# Patient Record
Sex: Male | Born: 1985 | Race: White | Hispanic: No | Marital: Single | Smoking: Current every day smoker
Health system: Southern US, Community
[De-identification: ages and names within clinical notes are randomized; demographics above are authoritative.]

## PROBLEM LIST (undated history)

## (undated) DIAGNOSIS — L0291 Cutaneous abscess, unspecified: Secondary | ICD-10-CM

## (undated) HISTORY — PX: APPENDECTOMY: SHX54

---
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
## 2007-01-04 ENCOUNTER — Telehealth (INDEPENDENT_AMBULATORY_CARE_PROVIDER_SITE_OTHER): Payer: Self-pay | Admitting: *Deleted

## 2007-01-04 ENCOUNTER — Ambulatory Visit: Payer: Self-pay | Admitting: Family Medicine

## 2007-01-04 DIAGNOSIS — S2341XA Sprain of ribs, initial encounter: Secondary | ICD-10-CM

## 2007-02-06 ENCOUNTER — Encounter (INDEPENDENT_AMBULATORY_CARE_PROVIDER_SITE_OTHER): Payer: Self-pay | Admitting: *Deleted

## 2007-02-18 ENCOUNTER — Emergency Department (HOSPITAL_COMMUNITY): Admission: EM | Admit: 2007-02-18 | Discharge: 2007-02-18 | Payer: Self-pay | Admitting: Emergency Medicine

## 2007-04-30 ENCOUNTER — Emergency Department (HOSPITAL_COMMUNITY): Admission: EM | Admit: 2007-04-30 | Discharge: 2007-04-30 | Payer: Self-pay | Admitting: Emergency Medicine

## 2007-04-30 ENCOUNTER — Telehealth (INDEPENDENT_AMBULATORY_CARE_PROVIDER_SITE_OTHER): Payer: Self-pay | Admitting: *Deleted

## 2007-05-09 ENCOUNTER — Ambulatory Visit: Payer: Self-pay | Admitting: Family Medicine

## 2007-05-09 DIAGNOSIS — L0201 Cutaneous abscess of face: Secondary | ICD-10-CM

## 2007-05-09 DIAGNOSIS — L03211 Cellulitis of face: Secondary | ICD-10-CM

## 2007-05-12 ENCOUNTER — Emergency Department (HOSPITAL_COMMUNITY): Admission: EM | Admit: 2007-05-12 | Discharge: 2007-05-12 | Payer: Self-pay | Admitting: Emergency Medicine

## 2007-08-16 ENCOUNTER — Emergency Department (HOSPITAL_COMMUNITY): Admission: EM | Admit: 2007-08-16 | Discharge: 2007-08-16 | Payer: Self-pay | Admitting: Emergency Medicine

## 2007-10-22 ENCOUNTER — Encounter (INDEPENDENT_AMBULATORY_CARE_PROVIDER_SITE_OTHER): Payer: Self-pay | Admitting: *Deleted

## 2007-12-22 ENCOUNTER — Telehealth (INDEPENDENT_AMBULATORY_CARE_PROVIDER_SITE_OTHER): Payer: Self-pay | Admitting: Family Medicine

## 2008-08-02 ENCOUNTER — Emergency Department (HOSPITAL_COMMUNITY): Admission: EM | Admit: 2008-08-02 | Discharge: 2008-08-02 | Payer: Self-pay | Admitting: Emergency Medicine

## 2009-01-10 ENCOUNTER — Encounter (INDEPENDENT_AMBULATORY_CARE_PROVIDER_SITE_OTHER): Payer: Self-pay | Admitting: *Deleted

## 2009-01-10 DIAGNOSIS — F17200 Nicotine dependence, unspecified, uncomplicated: Secondary | ICD-10-CM

## 2010-08-17 NOTE — H&P (Signed)
NAMEMELODY, SAVIDGE              ACCOUNT NO.:  [id number]   MEDICAL RECORD NO.:  [id number]          PATIENT TYPE:  OBV   LOCATION:  1845                         FACILITY:  [facility]   PHYSICIAN:  Adolph Pollack, M.D.DATE OF BIRTH:  1986-02-26   DATE OF ADMISSION:  09/22/2006  DATE OF DISCHARGE:                              HISTORY & PHYSICAL   REASON FOR ADMISSION:  Appendicitis.   HISTORY OF PRESENT ILLNESS:  This 25 year old male was in his normal  state of health until about 9:30 last night and began developing some  periumbilical pain.  He just thought it was a stomach ache.  He went to  bed and awoke at 4:00 this morning with progressive pain which  eventually radiated down to his right lower quadrant.  It as associated  with some nausea, but no vomiting.  No fever, but had some chills.  Mild  dysuria.  A little bit of loose stool.  He presented to the emergency  department and was evaluated, and CT scan was consistent with acute  appendicitis.  I was asked to see him at that time.   PAST MEDICAL HISTORY:  Depression.   PREVIOUS OPERATIONS:  None.   ALLERGIES:  None.   MEDICATIONS:  Prozac - just started yesterday.   SOCIAL HISTORY:  He is single.  He does smoke cigarettes.  He is working  Forensic psychologist.   REVIEW OF SYSTEMS:  CARDIAC:  No hypertension or heart disease.  PULMONARY:  No asthma or pneumonia.  GI:  No peptic ulcer disease or  hepatitis.  GU:  No kidney stones.  ENDOCRINE:  No diabetes or  hypothyroidism.  NEUROLOGIC:  No seizures.  HEMATOLOGIC:  No bleeding  disorders or transfusions.   PHYSICAL EXAMINATION:  GENERAL:  Well-developed, well-nourished male who  did not appear to be in acute distress at this time.  VITAL SIGNS:  Temperature 97.5, blood pressure 115/56, pulse 66.  HEENT:  Extraocular movements intact, no icterus.  NECK:  Supple without obvious masses.  RESPIRATORY:  Breath sounds equal and clear.  Respirations unlabored.  CARDIOVASCULAR:   Regular rate and rhythm.  No murmur.  ABDOMEN:  Soft.  There is some right lower quadrant tenderness to both  palpation and percussion, but no palpable mass.  No CVA tenderness.  MUSCULOSKELETAL:  Good range of motion.  No edema.  NEUROLOGIC:  Alert and oriented.  Answers questions appropriately.   DATA:  Laboratory data remarkable for a UA which is negative.  White  count is 14,700, hemoglobin 14.9.  CT scan was reviewed demonstrating a  dilated appendix with some mild inflammatory changes and a small amount  of free fluid.   IMPRESSION:  Acute appendicitis, does not appear ruptured at this time.   PLAN:  IV antibiotics and laparoscopic, possible open, appendectomy.  I  discussed the procedure and the risks with him.  The risks include but  are not limited to bleeding, infection, wound healing problems,  anesthesia, accidental damage to intraabdominal organs.  He understands  this and agrees to proceed.      Adolph Pollack, M.D.  Electronically Signed     TJR/MEDQ  D:  09/22/2006  T:  09/23/2006  Job:  [id number]

## 2010-08-17 NOTE — Op Note (Signed)
NAMEAMADOU, Timothy Hess              ACCOUNT NO.:  [id number]   MEDICAL RECORD NO.:  [id number]          PATIENT TYPE:  OBV   LOCATION:  2550                         FACILITY:  [facility]   PHYSICIAN:  Cherylynn Ridges, M.D.    DATE OF BIRTH:  1985-12-28   DATE OF PROCEDURE:  09/22/2006  DATE OF DISCHARGE:                               OPERATIVE REPORT   PREOPERATIVE DIAGNOSIS:  Acute appendicitis.   POSTOPERATIVE DIAGNOSIS:  Acute appendicitis.   PROCEDURE:  Laparoscopic appendectomy.   SURGEON:  Marta Lamas. Lindie Spruce, M.D.   ANESTHESIA:  General endotracheal.   ESTIMATED BLOOD LOSS:  Less than 20 mL.   COMPLICATIONS:  None.   CONDITION:  Stable.   INDICATION FOR OPERATION:  The patient is an 25 year old with abdominal  pain localized to the right lower quadrant, who now comes in for a  laparoscopic appendectomy after CT scan demonstrates acute appendicitis.   FINDINGS:  The patient had a nonperforated, suppurative acute  appendicitis.   OPERATION:  The patient was taken to the operating room and placed on  the table in supine position.  After an adequate general anesthetic was  administered, he was prepped and draped in usual sterile manner exposing  the midline of the abdomen.   A supraumbilical curvilinear incision was made using a #11 blade, taken  down to the midline fascia.  The patient had a small fascial break at  the umbilical area and this was used in order to access the posterior  peritoneum, which was of bluntly dissected open with Kocher clamps on  the anterior fascia.  A pursestring suture of 0 Vicryl was passed around  the fascial opening into the peritoneal cavity.  The Hassan cannula was  passed into the peritoneal cavity and secured in place with a  pursestring suture.  We insufflated carbon dioxide gas up to a maximal  intra-abdominal pressure of 15 mmHg.  Once this was done a right costal  margin 5-mm cannula and a suprapubic a 12-mm cannula were passed under  direct vision into the peritoneal cavity.  The patient was placed in  Trendelenburg, the left side was tilted down, and the dissection begun.   A suppurative, acutely inflamed, large appendix was mobilized, but there  were minimal adhesions to the appendix itself, although the cecum itself  was tethered down by adhesions which were taken down.  We were able to  mobilize the base of the appendix through a window created in the  mesoappendix.  At the base we passed an Endo-GIA 3.5-mm closure staples  across the base of the appendix and cecum, stapling it off and detaching  it from the cecum.   The 2.5-mm stapler was replaced on the Endo GIA, which was passed across  the mesoappendix and fired.  This left a bloodless stump of the  mesoappendix.  We retrieved the appendix from the abdominal cavity using  an Endopouch or EndoCatch bag, which was brought out from the suprapubic  area.  Once this was done we irrigated with a small amount of saline  solution and noted that the mesoappendix area was  clean and not  bleeding.  Once we had adequate hemostasis, we aspirated all fluid and  gas from above the liver.  We removed all cannulas and closed.   The pursestring suture was used to close off the fascia at the  supraumbilical site.  Once this was done 0.25% Marcaine with epinephrine  was injected in all sites and they were closed using a running  subcuticular stitch of 5-0 Vicryl.  Sterile dressings were applied.  All  needle counts, sponge counts and instrument counts were correct.      Cherylynn Ridges, M.D.  Electronically Signed     JOW/MEDQ  D:  09/22/2006  T:  09/23/2006  Job:  [id number]

## 2010-08-17 NOTE — H&P (Signed)
NAMEHARVIR, Timothy Hess              ACCOUNT NO.:  [id number]   MEDICAL RECORD NO.:  [id number]          PATIENT TYPE:  INP   LOCATION:  5707                         FACILITY:  [facility]   PHYSICIAN:  Cherylynn Ridges, M.D.    DATE OF BIRTH:  09-Jul-1985   DATE OF ADMISSION:  09/24/2006  DATE OF DISCHARGE:                              HISTORY & PHYSICAL   CHIEF COMPLAINT:  The patient is an 25 year old status post laparoscopic  appendectomy 2 days ago who now comes in for nausea, vomiting, fever and  dehydration.   HISTORY OF PRESENT ILLNESS:  The patient underwent an uneventful  laparoscopic appendectomy by Dr. Lindie Spruce.  Surgery was uneventful in the  sense that he did not have a ruptured appendix and had minimal bleeding.  Postoperatively, he had some low grade fevers, had a little bit of  abdominal pain problem in the first 24 hours; however, this resolved by  the first hospital day.  He was able to go home on postoperative day #1,  tolerating diet well, ambulating and voiding well.   While at home he developed progressively more nausea and vomiting.  Had  a fever up to 101.3.  He was brought into the emergency room but was  found to have nausea, no vomiting, no evidence of peritonitis or sepsis.  However; because he had intractable vomiting and likely was dehydrated,  we were asked to see the patient for consideration of readmission.   PAST MEDICAL HISTORY:  Unremarkable.   PAST SURGICAL HISTORY:  Status post laparoscopic appendectomy.   For other review of systems, family history, see his H&P on admission.   PHYSICAL EXAMINATION:  VITAL SIGNS:  Currently, he is afebrile at 97.7,  pulse is 75, blood pressure 122/74, O2 sat is 97% on room air.  HEENT:  He is normocephalic and atraumatic.  His mucous membranes appear  to be a bit parched.  SKIN:  Turgor is normal.  NECK:  Supple with no palpable masses.  LUNGS: Clear.  CARDIAC:  Regular rate and rhythm.  ABDOMEN:  Soft.  He has no  peritonitis.  Good bowel sounds.  His scars  are healing well.  RECTAL:  Exam was not performed.   LABORATORY DATA:  He has a white count of 12.7000, hemoglobin of 14.7,  hematocrit of 43, platelets 212.  Electrolytes were within normal limits  with a potassium of 3.5.  Three-way abdominal films are done which  demonstrated just a pattern of ileus but not even distended small bowel.   IMPRESSION:  Postoperative nausea, vomiting and fever, likely secondary  to inflammation from his acute appendicis   PLAN:  Admit the patient for nausea and pain control.  Start him on IV  Zofran p.r.n. for nausea.  Let him take clear liquids and then hopefully  after rehydration his symptoms will resolve and he will be able to go  home.      Cherylynn Ridges, M.D.  Electronically Signed     JOW/MEDQ  D:  09/24/2006  T:  09/24/2006  Job:  [id number]

## 2010-08-20 NOTE — Discharge Summary (Signed)
Timothy Hess, Timothy Hess              ACCOUNT NO.:  [id number]   MEDICAL RECORD NO.:  [id number]          PATIENT TYPE:  OBV   LOCATION:  5703                         FACILITY:  [facility]   PHYSICIAN:  Timothy Hess, M.D.    DATE OF BIRTH:  07-15-1985   DATE OF ADMISSION:  09/22/2006  DATE OF DISCHARGE:  09/23/2006                               DISCHARGE SUMMARY   DISCHARGING PHYSICIAN:  Dr. Lindie Hess.   OPERATIVE PHYSICIAN:  Dr. Lindie Hess.   CHIEF COMPLAINT/REASON FOR ADMISSION:  Timothy Hess is an 25 year old  otherwise healthy male patient who had less than 24 hours of  periumbilical pain prior to going to bed the night before admission.  He  was awakened at four in the morning with progressive pain, now located  in the right lower quadrant.  He presented to the ER.  He was afebrile.  His abdomen was tender in the right lower quadrant.  His white count was  elevated at 14,700.  CT scan demonstrated a dilated appendix with  inflammatory changes and a small amount of free fluid consistent with  acute appendicitis.  The patient was admitted by Dr. Abbey Hess with a  diagnosis of acute appendicitis.   HOSPITAL COURSE:  The patient was started on empiric antibiotic therapy  with plans for laparoscopic, possible open appendectomy.  Later in the  day on 09/22/2006, the patient's care was picked up by Dr. Lindie Hess who  took the patient to the OR that evening a little after 6 o'clock where  the patient underwent an uncomplicated laparoscopic appendectomy.  He  was stable and sent back to the floor to recover.   On postop day #1, he was tolerating a diet, tolerating oral pain  medications and was otherwise deemed appropriate for discharge home.   DISCHARGE DIAGNOSES:  1. Acute appendicitis with associated leukocytosis/  2. Status post laparoscopic appendectomy.   DISCHARGE MEDICATIONS:  Vicodin 1-2 tablets every 4 hours as needed for  pain.   Return to work in 3 weeks, note given.   ACTIVITY:   Increase activity slowly.  May walk up steps.  May shower.  No lifting for 3 weeks.  No sexual activity until pain free.   Follow up, call Dr. Lindie Hess at [phone] to be seen in 2 weeks.      Timothy Hess, N.P.      Timothy Hess, M.D.  Electronically Signed    ALE/MEDQ  D:  10/26/2006  T:  10/27/2006  Job:  [id number]   cc:   Timothy Hess, M.D.

## 2010-08-20 NOTE — Discharge Summary (Signed)
NAMEKAESYN, JOHNSTON              ACCOUNT NO.:  [id number]   MEDICAL RECORD NO.:  [id number]          PATIENT TYPE:  INP   LOCATION:  5707                         FACILITY:  [facility]   PHYSICIAN:  Cherylynn Ridges, M.D.    DATE OF BIRTH:  [birth year]   DATE OF ADMISSION:  09/24/2006  DATE OF DISCHARGE:  09/25/2006                               DISCHARGE SUMMARY   DISCHARGE DIAGNOSIS:  Dehydration; status post a laparoscopic  appendectomy.   PROCEDURE PERFORMED:  IV hydration and pain control.   DISPOSITION:  He was discharged home in care of his family.   MEDICATIONS ON DISCHARGE:  No new ones.   FOLLOW-UP:  He was to follow up with Dr. Lindie Spruce in two weeks.   HOSPITAL COURSE:  The patient was readmitted on the 22nd after having  undergone a laparoscopic appendectomy on the 20th.  When he went home,  he had a sort of failure to thrive with inability to eat, nausea,  vomiting and dehydration.  He was readmitted for IV hydration and pain  control which improved within 24 hours and he was discharged to home.  He has followup to see Dr. Lindie Spruce in two weeks.      Cherylynn Ridges, M.D.  Electronically Signed     JOW/MEDQ  D:  11/08/2006  T:  11/09/2006  Job:  [id number]

## 2010-11-02 ENCOUNTER — Emergency Department (HOSPITAL_COMMUNITY)
Admission: EM | Admit: 2010-11-02 | Discharge: 2010-11-03 | Disposition: A | Discharge status: Home / Self Care | Payer: Medicaid Other | Attending: Emergency Medicine | Admitting: Emergency Medicine

## 2010-11-02 DIAGNOSIS — R509 Fever, unspecified: Secondary | ICD-10-CM | POA: Insufficient documentation

## 2010-11-02 DIAGNOSIS — M542 Cervicalgia: Secondary | ICD-10-CM | POA: Insufficient documentation

## 2010-11-02 DIAGNOSIS — R51 Headache: Secondary | ICD-10-CM | POA: Insufficient documentation

## 2010-11-02 DIAGNOSIS — S139XXA Sprain of joints and ligaments of unspecified parts of neck, initial encounter: Secondary | ICD-10-CM | POA: Insufficient documentation

## 2010-11-02 DIAGNOSIS — B9789 Other viral agents as the cause of diseases classified elsewhere: Secondary | ICD-10-CM | POA: Insufficient documentation

## 2010-11-02 DIAGNOSIS — X58XXXA Exposure to other specified factors, initial encounter: Secondary | ICD-10-CM | POA: Insufficient documentation

## 2010-11-03 ENCOUNTER — Emergency Department (HOSPITAL_COMMUNITY): Payer: Medicaid Other

## 2010-11-03 LAB — DIFFERENTIAL
Basophils Absolute: 0.1 10*3/uL (ref 0.0–0.1)
Lymphs Abs: 1.1 10*3/uL (ref 0.7–4.0)
Monocytes Absolute: 0.5 10*3/uL (ref 0.1–1.0)
Monocytes Relative: 8 % (ref 3–12)
Neutrophils Relative %: 71 % (ref 43–77)
WBC Morphology: INCREASED

## 2010-11-03 LAB — URINALYSIS, ROUTINE W REFLEX MICROSCOPIC
Leukocytes, UA: NEGATIVE
Protein, ur: NEGATIVE mg/dL
Urobilinogen, UA: 1 mg/dL (ref 0.0–1.0)

## 2010-11-03 LAB — POCT I-STAT, CHEM 8
Glucose, Bld: 107 mg/dL — ABNORMAL HIGH (ref 70–99)
HCT: 44 % (ref 39.0–52.0)
Hemoglobin: 15 g/dL (ref 13.0–17.0)
Potassium: 3.5 mEq/L (ref 3.5–5.1)
Sodium: 137 mEq/L (ref 135–145)

## 2010-11-03 LAB — CBC
Hemoglobin: 14.6 g/dL (ref 13.0–17.0)
MCHC: 35.9 g/dL (ref 30.0–36.0)
RDW: 13 % (ref 11.5–15.5)
WBC: 5.9 10*3/uL (ref 4.0–10.5)

## 2010-11-23 ENCOUNTER — Inpatient Hospital Stay (INDEPENDENT_AMBULATORY_CARE_PROVIDER_SITE_OTHER)
Admission: RE | Admit: 2010-11-23 | Discharge: 2010-11-23 | Disposition: A | Discharge status: Home / Self Care | Payer: Medicaid Other | Source: Ambulatory Visit | Attending: Emergency Medicine | Admitting: Emergency Medicine

## 2010-11-23 DIAGNOSIS — L0211 Cutaneous abscess of neck: Secondary | ICD-10-CM

## 2010-11-23 LAB — GLUCOSE, CAPILLARY: Glucose-Capillary: 111 mg/dL — ABNORMAL HIGH (ref 70–99)

## 2011-01-19 LAB — URINE MICROSCOPIC-ADD ON

## 2011-01-19 LAB — I-STAT 8, (EC8 V) (CONVERTED LAB)
Acid-Base Excess: 2
BUN: 9
Bicarbonate: 24.1 — ABNORMAL HIGH
Chloride: 105
Glucose, Bld: 132 — ABNORMAL HIGH
HCT: 45
Hemoglobin: 15.3
Operator id: 270111
Potassium: 3.3 — ABNORMAL LOW
Sodium: 136
TCO2: 25
pCO2, Ven: 31.2 — ABNORMAL LOW
pH, Ven: 7.495 — ABNORMAL HIGH

## 2011-01-19 LAB — COMPREHENSIVE METABOLIC PANEL
ALT: 15
AST: 18
Calcium: 9.2
GFR calc Af Amer: >60
Glucose, Bld: 104 — ABNORMAL HIGH
Sodium: 136
Total Protein: 6.6

## 2011-01-19 LAB — CBC
HCT: 43.1
Hemoglobin: 14.7
MCHC: 33.6
MCHC: 34.1
MCV: 87.1
Platelets: 212
RBC: 4.95
RDW: 12.8
RDW: 13
WBC: 12.7 — ABNORMAL HIGH

## 2011-01-19 LAB — DIFFERENTIAL
Basophils Absolute: 0
Basophils Relative: 0
Eosinophils Absolute: 0
Eosinophils Absolute: 0.1
Eosinophils Relative: 0
Lymphocytes Relative: 4 — ABNORMAL LOW
Lymphs Abs: 0.6 — ABNORMAL LOW
Lymphs Abs: 1.8
Monocytes Absolute: 1.2
Monocytes Relative: 8
Monocytes Relative: 9
Neutro Abs: 10.9 — ABNORMAL HIGH
Neutrophils Relative %: 79 — ABNORMAL HIGH
Neutrophils Relative %: 86 — ABNORMAL HIGH

## 2011-01-19 LAB — URINALYSIS, ROUTINE W REFLEX MICROSCOPIC
Bilirubin Urine: NEGATIVE
Glucose, UA: NEGATIVE
Hgb urine dipstick: NEGATIVE
Ketones, ur: 40 — AB
Ketones, ur: NEGATIVE
Nitrite: NEGATIVE
Nitrite: NEGATIVE
Protein, ur: 30 — AB
Protein, ur: NEGATIVE
Specific Gravity, Urine: 1.046 — ABNORMAL HIGH
Urobilinogen, UA: 0.2
Urobilinogen, UA: 2 — ABNORMAL HIGH
pH: 7

## 2011-01-19 LAB — POCT I-STAT CREATININE
Creatinine, Ser: 1
Operator id: 270111

## 2011-05-21 ENCOUNTER — Emergency Department (HOSPITAL_COMMUNITY)
Admission: EM | Admit: 2011-05-21 | Discharge: 2011-05-21 | Disposition: A | Discharge status: Home / Self Care | Payer: Medicaid Other | Source: Home / Self Care | Attending: Emergency Medicine | Admitting: Emergency Medicine

## 2011-05-21 ENCOUNTER — Encounter (HOSPITAL_COMMUNITY): Payer: Self-pay | Admitting: *Deleted

## 2011-05-21 DIAGNOSIS — L0390 Cellulitis, unspecified: Secondary | ICD-10-CM

## 2011-05-21 DIAGNOSIS — L0291 Cutaneous abscess, unspecified: Secondary | ICD-10-CM

## 2011-05-21 DIAGNOSIS — L03221 Cellulitis of neck: Secondary | ICD-10-CM

## 2011-05-21 HISTORY — DX: Cutaneous abscess, unspecified: L02.91

## 2011-05-21 MED ORDER — SULFAMETHOXAZOLE-TRIMETHOPRIM 800-160 MG PO TABS: 1.0000 | ORAL_TABLET | Freq: Two times a day (BID) | ORAL | Status: AC

## 2011-05-21 MED ORDER — MUPIROCIN 2 % EX OINT: TOPICAL_OINTMENT | Freq: Two times a day (BID) | CUTANEOUS | Status: AC

## 2011-05-21 MED ORDER — TRAMADOL HCL 50 MG PO TABS: 100.0000 mg | ORAL_TABLET | Freq: Three times a day (TID) | ORAL | Status: AC | PRN

## 2011-05-21 MED ORDER — RIFAMPIN 300 MG PO CAPS: 300.0000 mg | ORAL_CAPSULE | Freq: Two times a day (BID) | ORAL | Status: AC

## 2011-05-21 NOTE — Discharge Instructions (Signed)
Abscess An abscess (boil or furuncle) is an infected area that contains a collection of pus.  SYMPTOMS Signs and symptoms of an abscess include pain, tenderness, redness, or hardness. You may feel a moveable soft area under your skin. An abscess can occur anywhere in the body.  TREATMENT  A surgical cut (incision) may be made over your abscess to drain the pus. Gauze may be packed into the space or a drain may be looped through the abscess cavity (pocket). This provides a drain that will allow the cavity to heal from the inside outwards. The abscess may be painful for a few days, but should feel much better if it was drained.  Your abscess, if seen early, may not have localized and may not have been drained. If not, another appointment may be required if it does not get better on its own or with medications. HOME CARE INSTRUCTIONS   Only take over-the-counter or prescription medicines for pain, discomfort, or fever as directed by your caregiver.   Take your antibiotics as directed if they were prescribed. Finish them even if you start to feel better.   Keep the skin and clothes clean around your abscess.   If the abscess was drained, you will need to use gauze dressing to collect any draining pus. Dressings will typically need to be changed 3 or more times a day.   The infection may spread by skin contact with others. Avoid skin contact as much as possible.   Practice good hygiene. This includes regular hand washing, cover any draining skin lesions, and do not share personal care items.   If you participate in sports, do not share athletic equipment, towels, whirlpools, or personal care items. Shower after every practice or tournament.   If a draining area cannot be adequately covered:   Do not participate in sports.   Children should not participate in day care until the wound has healed or drainage stops.   If your caregiver has given you a follow-up appointment, it is very important  to keep that appointment. Not keeping the appointment could result in a much worse infection, chronic or permanent injury, pain, and disability. If there is any problem keeping the appointment, you must call back to this facility for assistance.  SEEK MEDICAL CARE IF:   You develop increased pain, swelling, redness, drainage, or bleeding in the wound site.   You develop signs of generalized infection including muscle aches, chills, fever, or a general ill feeling.   You have an oral temperature above 102 F (38.9 C).  MAKE SURE YOU:   Understand these instructions.   Will watch your condition.   Will get help right away if you are not doing well or get worse.  Document Released: 12/29/2004 Document Revised: 12/01/2010 Document Reviewed: 10/23/2007 [facility] Patient Information 2012 [place], [place].  You have had an abscess drained.  An abscess is a collection of pus caused by infection with skin bacteria such as Streptococcus or Staphylococcus.  Since this is and infection, you may be contagious.  For the first 2 days, leave the dressing in place and keep it clean and dry. This means you should not get it wet.  You will have to take a sponge bath rather than a shower.  If the abscess was packed, we may instruct you to come back in 2 to 3 days to have the packing removed.  If the abscess was not packed, you may remove the dressing yourself in 2 days and take  care of the wound yourself.  After the packing is out, change the dressing at least once a day.  You may bathe or shower once the packing has been removed.  Assemble all the dressing material before you change the dressing, wear gloves, dispose of the soiled dressing material well and wash your hands before and after changing the dressing.  Wash the area well with soap and water, taking care to remove all the dried blood and drainage.  Apply a thin layer of antibiotic ointment (Bacitracin or Polysporin) around the abscess cavity, then apply  a gauze dressing.  You may want to use a non-adherent dressing like Telfa.  Fasten this in place well with tape.  Continue to change the dressing until there is no further drainage.  Finish up the entire prescription of any antibiotics that you have been given.  Take infectious precautions since the bacteria that cause these abscesses may be contagious.  Wash hands frequently or use hand sanitizer, especially after touching the abscess area or changing dressings.  Do not allow anyone else to use your towel or washcloth and wash these items after each use until the abscess has healed.  You may want to use an antibacterial soap such as Dial or Safeguard or a prescription body wash like Hibiclens.  You also may want to consider spraying the tub or shower with a disinfectant such a Lysol until the abscess has healed.  Things that should prompt you to return to the office for a recheck include:  Fever over 100 degrees, increasing pain or drainage, failure of the abscess to heal after 10 days, or other skin lesions elsewhere.   

## 2011-05-21 NOTE — ED Notes (Signed)
Pt with abscess posterior neck and left side of face -  Back of neck abscess drained few months ago per pt - painful last few days

## 2011-05-21 NOTE — ED Provider Notes (Signed)
Chief Complaint  Patient presents with  . Abscess  . Recurrent Skin Infections    History of Present Illness:  Rakin is a 26 year old male who has had recurring abscesses. The first of these was last summer. He had abscess in his posterior neck this was I&D and showed MRSA. He was treated with doxycycline and got better and over the past month he's had recurrence of the abscesses both on the posterior neck and along the left jaw line. He denies any fever, chills, or drainage from any of these lesions. Both his wife and his son had MRSA as well.  Review of Systems:  Other than noted above, the patient denies any of the following symptoms: Systemic:  No fever, chills or sweats. Skin:  No rash or itching.  PMFSH:  Past medical history, family history, social history, meds, and allergies were reviewed.  Physical Exam:   Vital signs:  BP 137/67  Pulse 58  Temp(Src) 98.3 F (36.8 C) (Oral)  Resp 18  SpO2 100% Skin:  There is a 1.5 cm raised, red, tender nodule in the posterior neck just at the hairline. This was somewhat fluctuant. He also has a tiny erythematous bump along the left anterior neck and a large lymph node at the jugulodigastric area,  Otherwise normal.  No rash.  Procedure:  Verbal informed consent was obtained.  The patient was informed of the risks and benefits of the procedure and understands and accepts.  Identity of the patient was verified verbally and by wristband.   The abscess area described above was prepped with Betadine and alcohol and anesthetized with 3 mL of 2% Xylocaine with epinephrine.  Using a #11 scalpel blade, a singe straight incision was made into the area of fluctulence, yielding a small amount of prurulent drainage.  Routine cultures were obtained.  Blunt dissection was used to break up loculations and the resulting wound cavity was packed with 1/4 inch Iodoform gauze.  A sterile pressure dressing was applied.  Assessment:   Diagnoses that have been ruled  out:  None  Diagnoses that are still under consideration:  None  Final diagnoses:  Abscess    Plan:   1.  The following meds were prescribed:   New Prescriptions   MUPIROCIN OINTMENT (BACTROBAN) 2 %    Apply topically 2 (two) times daily.   RIFAMPIN (RIFADIN) 300 MG CAPSULE    Take 1 capsule (300 mg total) by mouth 2 (two) times daily.   SULFAMETHOXAZOLE-TRIMETHOPRIM (BACTRIM DS,SEPTRA DS) 800-160 MG PER TABLET    Take 1 tablet by mouth 2 (two) times daily.   TRAMADOL (ULTRAM) 50 MG TABLET    Take 2 tablets (100 mg total) by mouth every 8 (eight) hours as needed for pain.   2.  The patient was instructed in symptomatic care and handouts were given. 3.  The patient was instructed to leave the dressing in place and return again in 48 hours for packing removal.  Roque Lias, MD 05/21/11 2049

## 2011-05-24 LAB — CULTURE, ROUTINE-ABSCESS: Special Requests: NORMAL

## 2011-05-25 NOTE — ED Notes (Signed)
Abscess culture neck: Few Staph. Aureus. Pt. adequately treated with Bactrim DS. Vassie Moselle 05/25/2011

## 2012-10-04 ENCOUNTER — Encounter (HOSPITAL_COMMUNITY): Payer: Self-pay | Admitting: Emergency Medicine

## 2012-10-04 ENCOUNTER — Emergency Department (HOSPITAL_COMMUNITY)
Admission: EM | Admit: 2012-10-04 | Discharge: 2012-10-04 | Disposition: A | Discharge status: Home / Self Care | Payer: Self-pay | Attending: Emergency Medicine | Admitting: Emergency Medicine

## 2012-10-04 DIAGNOSIS — M545 Low back pain, unspecified: Secondary | ICD-10-CM | POA: Insufficient documentation

## 2012-10-04 DIAGNOSIS — R269 Unspecified abnormalities of gait and mobility: Secondary | ICD-10-CM | POA: Insufficient documentation

## 2012-10-04 DIAGNOSIS — Z79899 Other long term (current) drug therapy: Secondary | ICD-10-CM | POA: Insufficient documentation

## 2012-10-04 DIAGNOSIS — Z87828 Personal history of other (healed) physical injury and trauma: Secondary | ICD-10-CM | POA: Insufficient documentation

## 2012-10-04 DIAGNOSIS — IMO0001 Reserved for inherently not codable concepts without codable children: Secondary | ICD-10-CM | POA: Insufficient documentation

## 2012-10-04 DIAGNOSIS — F17200 Nicotine dependence, unspecified, uncomplicated: Secondary | ICD-10-CM | POA: Insufficient documentation

## 2012-10-04 DIAGNOSIS — Z872 Personal history of diseases of the skin and subcutaneous tissue: Secondary | ICD-10-CM | POA: Insufficient documentation

## 2012-10-04 MED ORDER — DIAZEPAM 5 MG PO TABS
10.0000 mg | ORAL_TABLET | Freq: Once | ORAL | Status: AC
  Administered 2012-10-04: 10 mg via ORAL
  Filled 2012-10-04: qty 2

## 2012-10-04 MED ORDER — IBUPROFEN 800 MG PO TABS: 800.0000 mg | ORAL_TABLET | Freq: Three times a day (TID) | ORAL | Status: DC

## 2012-10-04 MED ORDER — DIAZEPAM 5 MG PO TABS: 5.0000 mg | ORAL_TABLET | Freq: Two times a day (BID) | ORAL | Status: DC | PRN

## 2012-10-04 MED ORDER — IBUPROFEN 800 MG PO TABS
800.0000 mg | ORAL_TABLET | Freq: Once | ORAL | Status: AC
  Administered 2012-10-04: 800 mg via ORAL
  Filled 2012-10-04: qty 1

## 2012-10-04 NOTE — ED Provider Notes (Signed)
Medical screening examination/treatment/procedure(s) were performed by non-physician practitioner and as supervising physician I was immediately available for consultation/collaboration.   Lyanne Co, MD 10/04/12 [phone]

## 2012-10-04 NOTE — ED Provider Notes (Signed)
History    CSN: [id number] Arrival date & time 10/04/12  1223  First MD Initiated Contact with Patient 10/04/12 1232     Chief Complaint  Patient presents with  . Back Pain   (Consider location/radiation/quality/duration/timing/severity/associated sxs/prior Treatment) HPI Pt is a 27yo male c/o LBP, states he had a back injury when he was younger.  This morning when getting out of bed, back pain flared up.  Difficult to get out of bed. Pain is constant, sharp and stabbing in lower back, worse with movement, 8/10. Has not tried any pain meds. Pt is walking on his feet all day at work yesterday. Denies any heavy lifting.  Denies numbness or tingling in groin or legs. Denies loss of bowel or bladder. Denies inability to void. Denies dysuria or hematuria.  No fever, n/v/d. No hx of CA or IVDU.   Past Medical History  Diagnosis Date  . Abscess    Past Surgical History  Procedure Laterality Date  . Appendectomy     No family history on file. History  Substance Use Topics  . Smoking status: Current Every Day Smoker -- 1.00 packs/day for 5 years    Types: Cigarettes  . Smokeless tobacco: Never Used  . Alcohol Use: Yes    Review of Systems  Constitutional: Negative for fever and chills.  Genitourinary: Negative for dysuria, hematuria and flank pain.  Musculoskeletal: Positive for myalgias, back pain and gait problem ( pain with walking). Negative for arthralgias.  Skin: Negative for rash.  All other systems reviewed and are negative.    Allergies  Vicodin  Home Medications   Current Outpatient Rx  Name  Route  Sig  Dispense  Refill  . diazepam (VALIUM) 5 MG tablet   Oral   Take 1 tablet (5 mg total) by mouth every 12 (twelve) hours as needed (Take 1-2 pills every 8-12 hours as needed for back pain).   10 tablet   0   . doxycycline (ADOXA) 100 MG tablet   Oral   Take 100 mg by mouth 2 (two) times daily. X 10 days          . FLUoxetine (PROZAC) 20 MG capsule   Oral  Take 20 mg by mouth daily.           Marland Kitchen ibuprofen (ADVIL,MOTRIN) 800 MG tablet   Oral   Take 1 tablet (800 mg total) by mouth 3 (three) times daily.   21 tablet   0   . propoxyphene-acetaminophen (DARVOCET-N 100) 100-650 MG per tablet   Oral   Take 1 tablet by mouth every 4 (four) hours as needed.            BP 138/76  Pulse 62  Temp(Src) 97.8 F (36.6 C) (Oral)  Resp 18  SpO2 100% Physical Exam  Nursing note and vitals reviewed. Constitutional: He appears well-developed and well-nourished. No distress.  Pt sitting in exam chair, appears uncomfortable, shifting wearing in chair  HENT:  Head: Normocephalic and atraumatic.  Eyes: Conjunctivae are normal. No scleral icterus.  Neck: Normal range of motion. Neck supple.  No midline bone tenderness, no crepitus or step-offs.   No nuchal rigidity or meningeal signs.    Cardiovascular: Normal rate, regular rhythm and normal heart sounds.   Pulmonary/Chest: Effort normal and breath sounds normal. No respiratory distress. He has no wheezes. He has no rales. He exhibits no tenderness.  Musculoskeletal: Normal range of motion. He exhibits tenderness ( paraspinal muscles of lumbar spine). He  exhibits no edema.  Muscular tenderness, no bony spinal tenderness, stepoffs or crepitus  Neurological: He is alert.  Antalgic gait   Skin: Skin is warm and dry. He is not diaphoretic.  Psychiatric: He has a normal mood and affect. His behavior is normal.    ED Course  Procedures (including critical care time) Labs Reviewed - No data to display No results found. 1. LBP (low back pain)     MDM  No red flags. No recent trauma.  Flare of chronic LBP.  Tx with valium and Ibuprofen in ED. Rx: valium and ibuprofen.  Will discharge pt home and have him f/u with his PCP at [facility] as needed for LBP and ongoing medical needs. Return precautions given. Pt verbalized understanding and agreement with tx plan. Vitals: unremarkable.  Discharged in stable condition.      Junius Finner, PA-C 10/04/12 1334

## 2012-10-04 NOTE — ED Notes (Signed)
Pt reports 8/10 lower back pain. Pt reports having chronic back pain from an fall accident when he was a teenager. Pt reports he had slight pain yesterday, however the pain was unbearable when he awoke this morning. Pt reports not being able to cough, bending or stretching because it increases the pain.

## 2013-03-15 IMAGING — CR DG CERVICAL SPINE COMPLETE 4+V
5 series · 5 of 5 positions shown · non-contrast
Comparison: None.

CLINICAL DATA: Neck injury.  Neck pain.

CERVICAL SPINE - COMPLETE 4+ VIEW

[w c-spine lat]
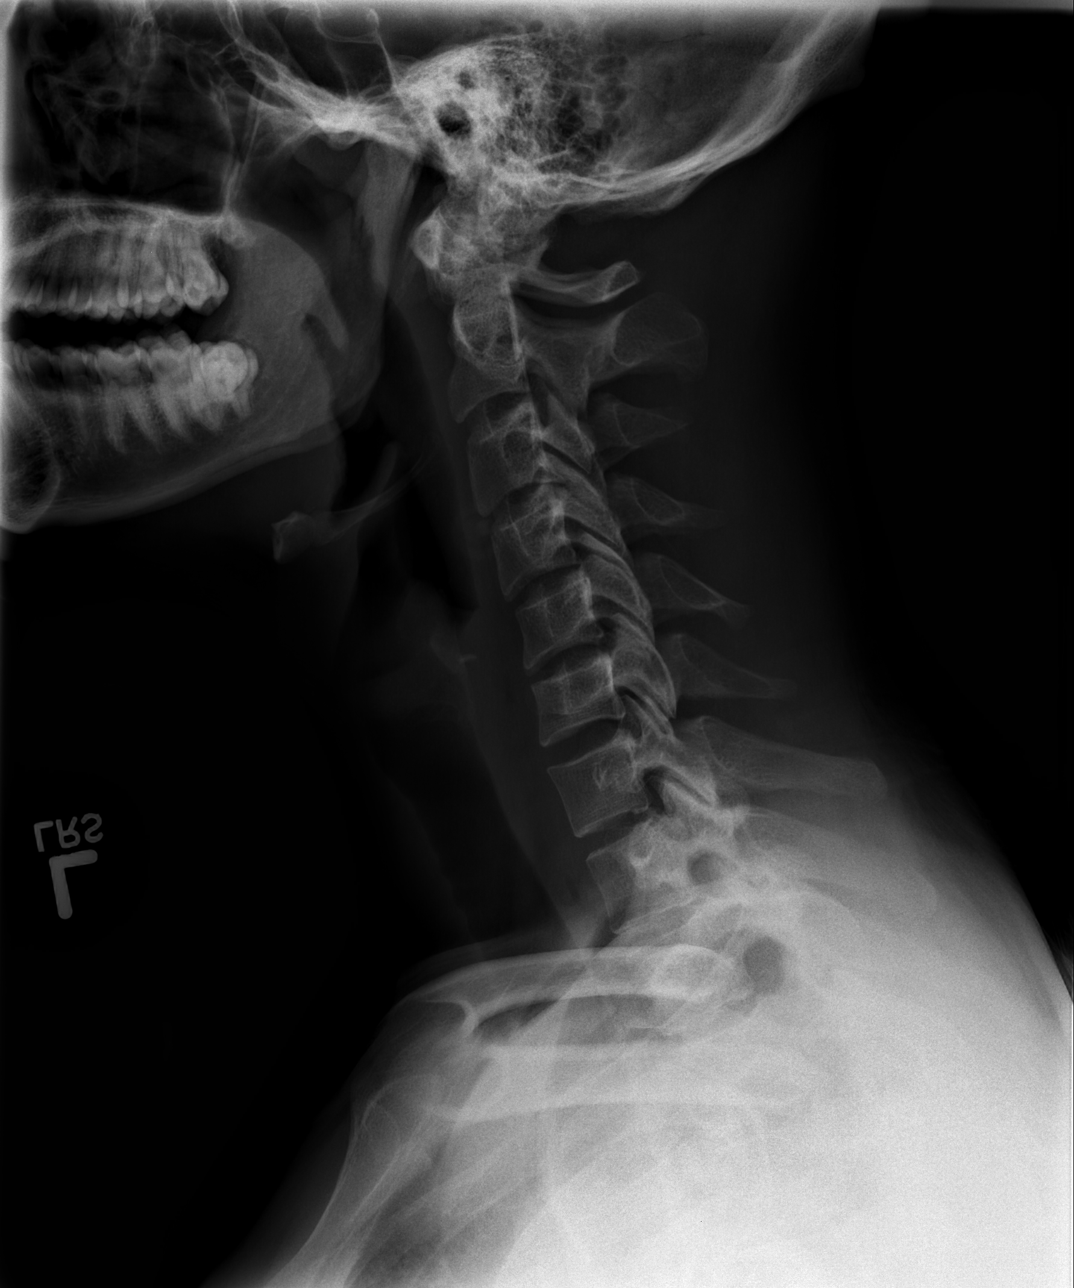

[w c-spine oblique (1 of 2)]
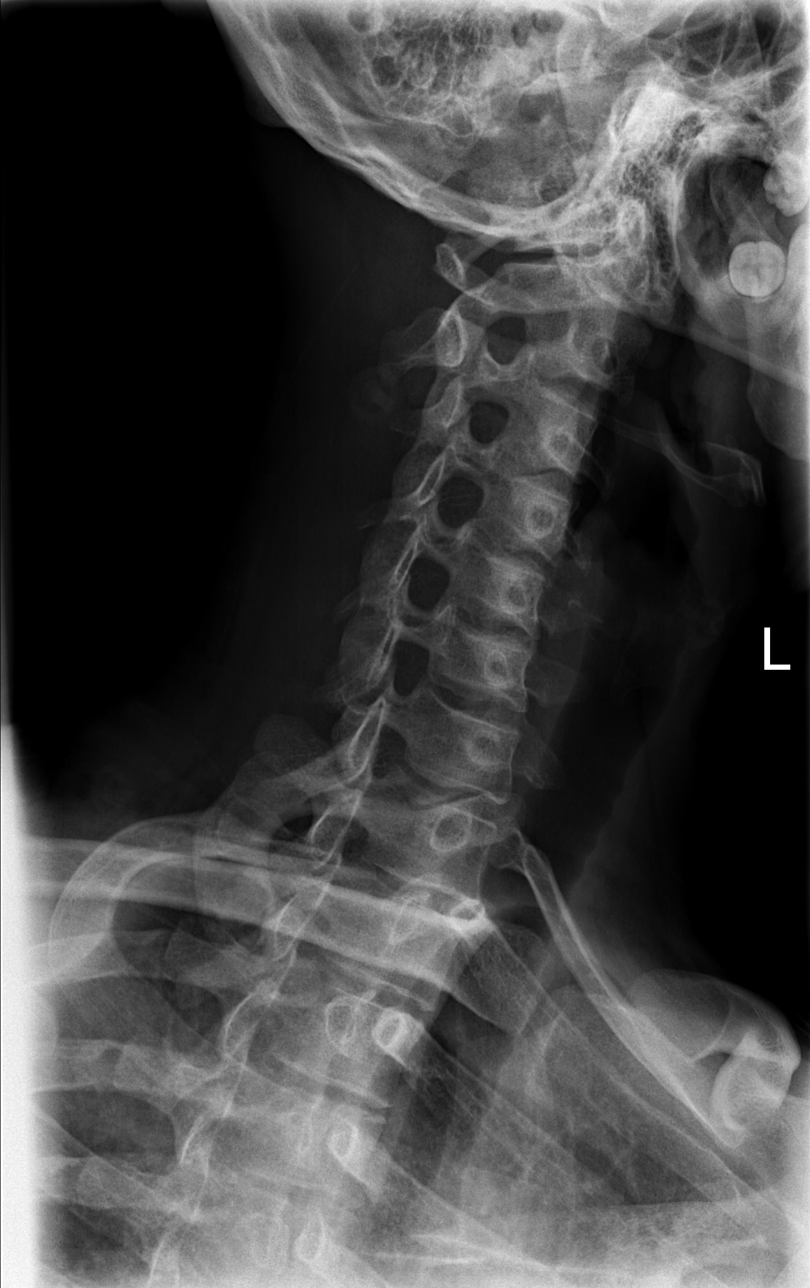

[w c-spine oblique (2 of 2)]
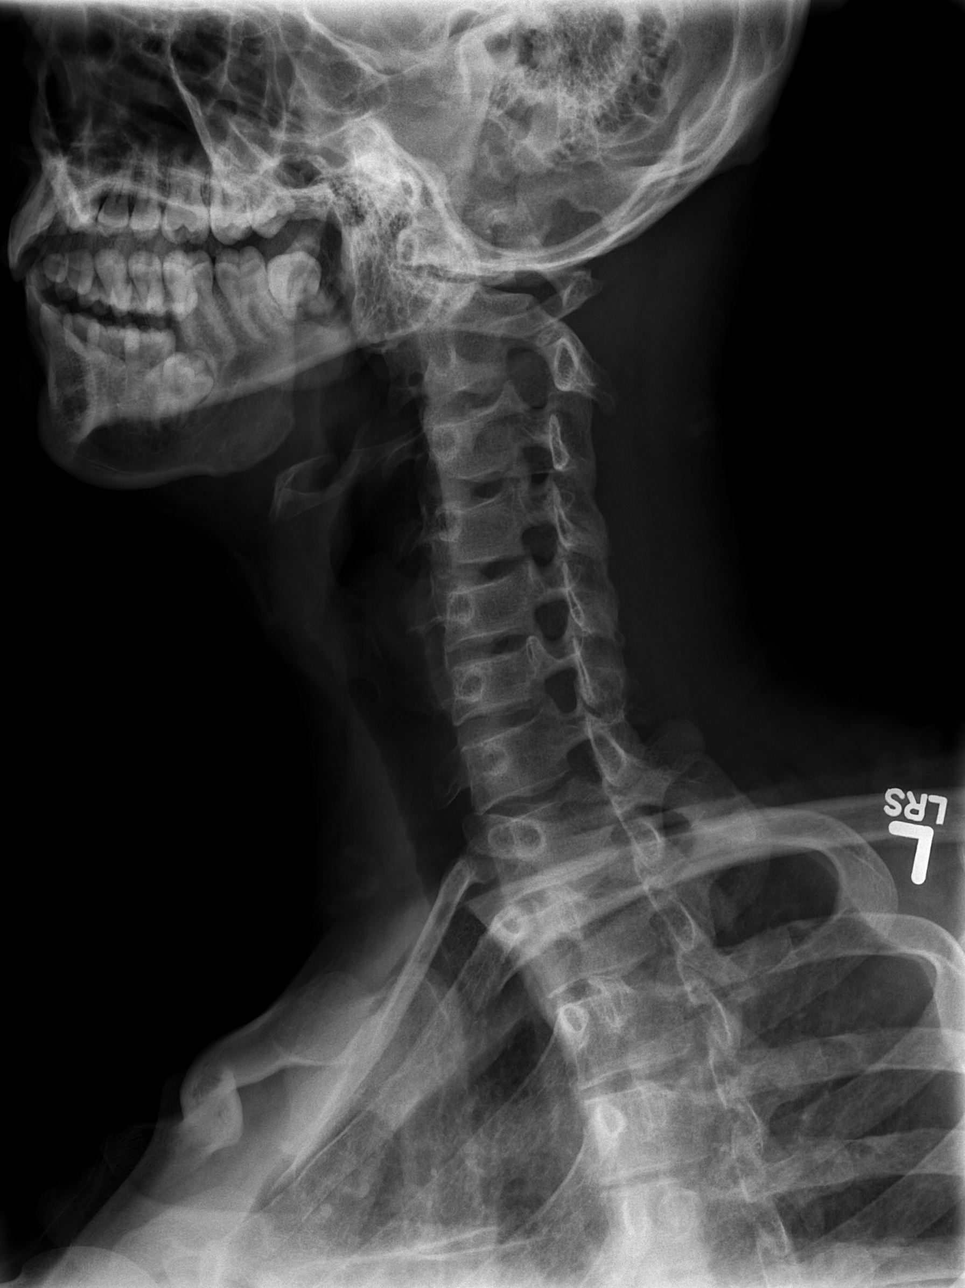

[w c-spine a.p.]
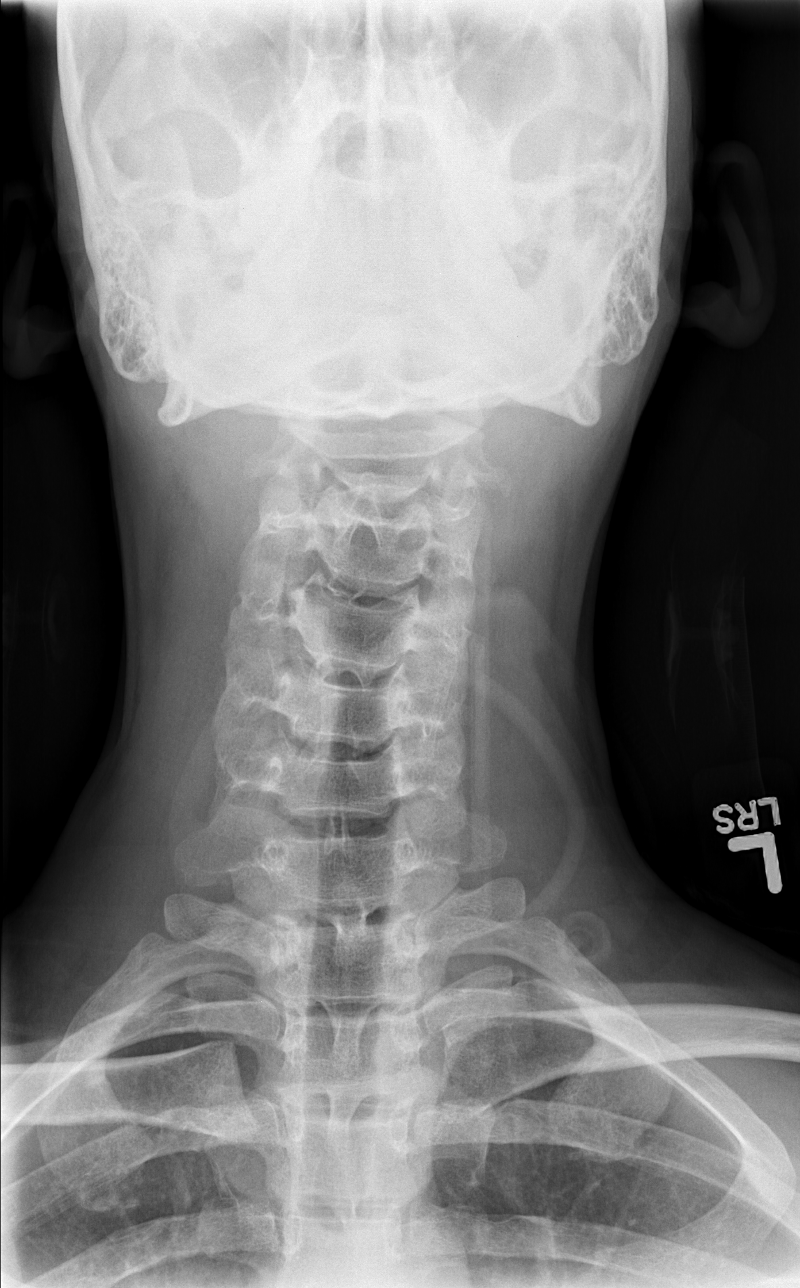

[w c-spine odontoid]
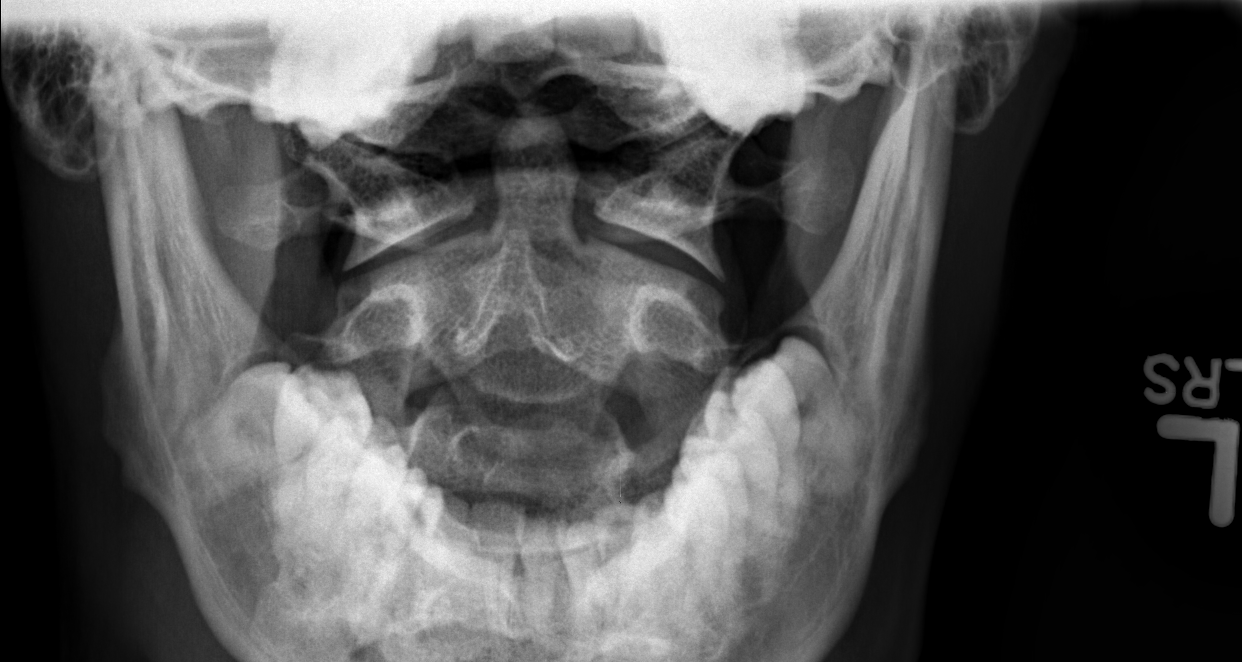

[5 of 5 positions shown; findings below may reference images not displayed]

FINDINGS: No evidence of cervical spine fracture or subluxation.
No evidence of prevertebral soft tissue swelling.

Intervertebral disc spaces are maintained.  No evidence of facet
arthropathy or other focal bone abnormality.  Loss of the normal
cervical lordosis is noted, and this may be due to muscle spasm or
patient positioning.
IMPRESSION: 1.  No evidence of cervical spine fracture or dislocation.
2.  Loss of normal cervical lordosis, which may be due to muscle
spasm or patient positioning; clinical correlation is recommended.

## 2014-03-31 ENCOUNTER — Emergency Department (HOSPITAL_COMMUNITY)
Admission: EM | Admit: 2014-03-31 | Discharge: 2014-03-31 | Disposition: A | Discharge status: Home / Self Care | Payer: Medicaid Other | Attending: Emergency Medicine | Admitting: Emergency Medicine

## 2014-03-31 ENCOUNTER — Encounter (HOSPITAL_COMMUNITY): Payer: Self-pay | Admitting: Family Medicine

## 2014-03-31 DIAGNOSIS — L720 Epidermal cyst: Secondary | ICD-10-CM | POA: Insufficient documentation

## 2014-03-31 DIAGNOSIS — Z792 Long term (current) use of antibiotics: Secondary | ICD-10-CM | POA: Insufficient documentation

## 2014-03-31 DIAGNOSIS — Z720 Tobacco use: Secondary | ICD-10-CM | POA: Insufficient documentation

## 2014-03-31 DIAGNOSIS — Z791 Long term (current) use of non-steroidal anti-inflammatories (NSAID): Secondary | ICD-10-CM | POA: Insufficient documentation

## 2014-03-31 DIAGNOSIS — Z79899 Other long term (current) drug therapy: Secondary | ICD-10-CM | POA: Insufficient documentation

## 2014-03-31 LAB — CBC WITH DIFFERENTIAL/PLATELET
BASOS ABS: 0 10*3/uL (ref 0.0–0.1)
BASOS PCT: 0 % (ref 0–1)
Eosinophils Absolute: 0.1 10*3/uL (ref 0.0–0.7)
Eosinophils Relative: 1 % (ref 0–5)
HCT: 43.5 % (ref 39.0–52.0)
Hemoglobin: 15 g/dL (ref 13.0–17.0)
LYMPHS PCT: 16 % (ref 12–46)
Lymphs Abs: 1 10*3/uL (ref 0.7–4.0)
MCH: 28.6 pg (ref 26.0–34.0)
MCHC: 34.5 g/dL (ref 30.0–36.0)
MCV: 83 fL (ref 78.0–100.0)
Monocytes Absolute: 0.6 10*3/uL (ref 0.1–1.0)
Monocytes Relative: 9 % (ref 3–12)
NEUTROS ABS: 4.5 10*3/uL (ref 1.7–7.7)
NEUTROS PCT: 74 % (ref 43–77)
PLATELETS: 223 10*3/uL (ref 150–400)
RBC: 5.24 MIL/uL (ref 4.22–5.81)
RDW: 12.7 % (ref 11.5–15.5)
WBC: 6.1 10*3/uL (ref 4.0–10.5)

## 2014-03-31 LAB — COMPREHENSIVE METABOLIC PANEL
ALBUMIN: 4.4 g/dL (ref 3.5–5.2)
ALK PHOS: 77 U/L (ref 39–117)
ALT: 31 U/L (ref 0–53)
AST: 33 U/L (ref 0–37)
Anion gap: 7 (ref 5–15)
BILIRUBIN TOTAL: 0.8 mg/dL (ref 0.3–1.2)
BUN: 12 mg/dL (ref 6–23)
CHLORIDE: 104 meq/L (ref 96–112)
CO2: 27 mmol/L (ref 19–32)
CREATININE: 0.95 mg/dL (ref 0.50–1.35)
Calcium: 9.3 mg/dL (ref 8.4–10.5)
GFR calc Af Amer: >90 mL/min (ref >90)
GFR calc non Af Amer: >90 mL/min (ref >90)
Glucose, Bld: 96 mg/dL (ref 70–99)
POTASSIUM: 3.7 mmol/L (ref 3.5–5.1)
SODIUM: 138 mmol/L (ref 135–145)
Total Protein: 7.7 g/dL (ref 6.0–8.3)

## 2014-03-31 MED ORDER — AMOXICILLIN 500 MG PO CAPS: 500.0000 mg | ORAL_CAPSULE | Freq: Three times a day (TID) | ORAL | Status: DC

## 2014-03-31 NOTE — ED Notes (Signed)
Per pt sts swollen lymph node x 1 year. Denies any other problems associated.

## 2014-03-31 NOTE — Discharge Instructions (Signed)
Epidermal Cyst  An epidermal cyst is sometimes called a sebaceous cyst, epidermal inclusion cyst, or infundibular cyst. These cysts usually contain a substance that looks "pasty" or "cheesy" and may have a bad smell. This substance is a protein called keratin. Epidermal cysts are usually found on the face, neck, or trunk. They may also occur in the vaginal area or other parts of the genitalia of both men and women. Epidermal cysts are usually small, painless, slow-growing bumps or lumps that move freely under the skin. It is important not to try to pop them. This may cause an infection and lead to tenderness and swelling.  CAUSES   Epidermal cysts may be caused by a deep penetrating injury to the skin or a plugged hair follicle, often associated with acne.  SYMPTOMS   Epidermal cysts can become inflamed and cause:  · Redness.  · Tenderness.  · Increased temperature of the skin over the bumps or lumps.  · Grayish-white, bad smelling material that drains from the bump or lump.  DIAGNOSIS   Epidermal cysts are easily diagnosed by your caregiver during an exam. Rarely, a tissue sample (biopsy) may be taken to rule out other conditions that may resemble epidermal cysts.  TREATMENT   · Epidermal cysts often get better and disappear on their own. They are rarely ever cancerous.  · If a cyst becomes infected, it may become inflamed and tender. This may require opening and draining the cyst. Treatment with antibiotics may be necessary. When the infection is gone, the cyst may be removed with minor surgery.  · Small, inflamed cysts can often be treated with antibiotics or by injecting steroid medicines.  · Sometimes, epidermal cysts become large and bothersome. If this happens, surgical removal in your caregiver's office may be necessary.  HOME CARE INSTRUCTIONS  · Only take over-the-counter or prescription medicines as directed by your caregiver.  · Take your antibiotics as directed. Finish them even if you start to feel  better.  SEEK MEDICAL CARE IF:   · Your cyst becomes tender, red, or swollen.  · Your condition is not improving or is getting worse.  · You have any other questions or concerns.  MAKE SURE YOU:  · Understand these instructions.  · Will watch your condition.  · Will get help right away if you are not doing well or get worse.  Document Released: 02/20/2004 Document Revised: 06/13/2011 Document Reviewed: 09/27/2010  ExitCare® Patient Information ©2015 [employer]. This information is not intended to replace advice given to you by your health care provider. Make sure you discuss any questions you have with your health care provider.  Cyst Removal  Your caregiver has removed a cyst. A cyst is a sac containing a semi-solid material. Cysts may occur any place on your body. They may remain small for years or gradually get larger. A sebaceous cyst is an enlarged (dilated) sweat gland filled with old sweat (sebum). Unattended, these may become large (the size of a softball) over several years time. These are often removed for improved appearance (cosmetic) reasons or before they become infected to form an abscess. An abscess is an infected cyst.  HOME CARE INSTRUCTIONS   · Keep your bandage clean and dry. You may change your bandage after 24 hours. If your bandage sticks, use warm water to gently loosen it. Pat the area dry with a clean towel before putting on another bandage.  · If possible, keep the area where the cyst was removed raised to   relieve soreness, swelling, and promote healing.  · If you have stitches, keep them clean and dry.  · You may clean your stitches gently with a cotton swab dipped in warm soapy water.  · Do not soak the area where the cyst was removed or go swimming. You may shower.  · Do not overuse the area where your cyst was removed.  · Return in 7 days or as directed to have your stitches removed.  · Take medicines as instructed by your caregiver.  SEEK IMMEDIATE MEDICAL CARE IF:   · An oral  temperature above 102° F (38.9° C) develops, not controlled by medication.  · Blood continues to soak through the bandage.  · You have increasing pain in the area where your cyst was removed.  · You have redness, swelling, pus, a bad smell, soreness (inflammation), or red streaks coming away from the stitches. These are signs of infection.  MAKE SURE YOU:   · Understand these instructions.  · Will watch your condition.  · Will get help right away if you are not doing well or get worse.  Document Released: 03/18/2000 Document Revised: 06/13/2011 Document Reviewed: 07/12/2007  ExitCare® Patient Information ©2015 [employer]. This information is not intended to replace advice given to you by your health care provider. Make sure you discuss any questions you have with your health care provider.

## 2014-03-31 NOTE — ED Provider Notes (Signed)
CSN: [id number]     Arrival date & time 03/31/14  1424 History  This chart was scribed for Dorthula Matasiffany G Kirbie Stodghill, PA-C, working with Donnetta HutchingBrian Cook, MD by Elon SpannerGarrett Cook, ED Scribe. This patient was seen in room TR09C/TR09C and the patient's care was started at 5:18 PM.   Chief Complaint  Patient presents with  . swollen lymph node    The history is provided by the patient. No language interpreter was used.   HPI Comments: Timothy LittlesJoshua S Hess is a 28 y.o. male who presents to the Emergency Department complaining of an unchanged knot on the left side of his neck onset 1 year ago.  Patient denies use of antibiotics.  Patient denies pain to the bump, neck pain, jaw pain, fever, unexpected weight changes, headaches, sore throat, vomiting, diarrhea, drooling, difficulty breathing.  NKA.  Otherwise healthy  Past Medical History  Diagnosis Date  . Abscess    Past Surgical History  Procedure Laterality Date  . Appendectomy     History reviewed. No pertinent family history. History  Substance Use Topics  . Smoking status: Current Every Day Smoker -- 1.00 packs/day for 5 years    Types: Cigarettes  . Smokeless tobacco: Never Used  . Alcohol Use: Yes    Review of Systems  All other systems reviewed and are negative.   Allergies  Vicodin  Home Medications   Prior to Admission medications   Medication Sig Start Date End Date Taking? Authorizing Provider  amoxicillin (AMOXIL) 500 MG capsule Take 1 capsule (500 mg total) by mouth 3 (three) times daily. 03/31/14   Raygen Dahm Irine SealG Bawi Lakins, PA-C  diazepam (VALIUM) 5 MG tablet Take 1 tablet (5 mg total) by mouth every 12 (twelve) hours as needed (Take 1-2 pills every 8-12 hours as needed for back pain). 10/04/12   Junius FinnerErin O'Malley, PA-C  doxycycline (ADOXA) 100 MG tablet Take 100 mg by mouth 2 (two) times daily. X 10 days     Historical Provider, MD  FLUoxetine (PROZAC) 20 MG capsule Take 20 mg by mouth daily.      Historical Provider, MD  ibuprofen (ADVIL,MOTRIN)  800 MG tablet Take 1 tablet (800 mg total) by mouth 3 (three) times daily. 10/04/12   Junius FinnerErin O'Malley, PA-C  propoxyphene-acetaminophen (DARVOCET-N 100) 100-650 MG per tablet Take 1 tablet by mouth every 4 (four) hours as needed.      Historical Provider, MD   BP 123/74 mmHg  Pulse 76  Temp(Src) 98.4 F (36.9 C) (Oral)  Resp 18  Ht 5\' 10"  (1.778 m)  Wt 168 lb (76.204 kg)  BMI 24.11 kg/m2  SpO2 100% Physical Exam  Constitutional: He is oriented to person, place, and time. He appears well-developed and well-nourished. No distress.  HENT:  Head: Normocephalic and atraumatic.  Eyes: Conjunctivae and EOM are normal.  Neck: Neck supple. No spinous process tenderness and no muscular tenderness present. No rigidity. No tracheal deviation, no edema, no erythema and normal range of motion present. No Brudzinski's sign and no Kernig's sign noted.    Cardiovascular: Normal rate.   Pulmonary/Chest: Effort normal. No respiratory distress.  Musculoskeletal: Normal range of motion.  Neurological: He is alert and oriented to person, place, and time.  Skin: Skin is warm and dry.  Psychiatric: He has a normal mood and affect. His behavior is normal.  Nursing note and vitals reviewed.   ED Course  Procedures (including critical care time)  DIAGNOSTIC STUDIES: Oxygen Saturation is 1000% on RA, normal by my interpretation.  COORDINATION OF CARE:  5:25 PM Advised patient of suspicion of cyst.  Will order antibiotics and provide patient referral for ultimate removal of cyst.    Labs Review Labs Reviewed  CBC WITH DIFFERENTIAL  COMPREHENSIVE METABOLIC PANEL    Imaging Review No results found.   EKG Interpretation None      MDM   Final diagnoses:  Epidermal cyst of neck    Patients cyst appears to be sebaceous and non infectious. Cancer is unlikely due to quality and lack of systemic symptoms. Due to area or cyst I do recommend her be evaluated for removal and biopsy. I have referred  him to Dr. Wayland Denislaire Sanger. I have also given Rx for abx in case it is due to underlying infection.  28 y.o.Timothy Hess's evaluation in the Emergency Department is complete. It has been determined that no acute conditions requiring further emergency intervention are present at this time. The patient/guardian have been advised of the diagnosis and plan. We have discussed signs and symptoms that warrant return to the ED, such as changes or worsening in symptoms.  Vital signs are stable at discharge. Filed Vitals:   03/31/14 1453  BP: 123/74  Pulse: 76  Temp: 98.4 F (36.9 C)  Resp: 18    Patient/guardian has voiced understanding and agreed to follow-up with the PCP or specialist.  I personally performed the services described in this documentation, which was scribed in my presence. The recorded information has been reviewed and is accurate.   Dorthula Matasiffany G Berdena Cisek, PA-C 03/31/14 1732  Donnetta HutchingBrian Cook, MD 04/01/14 0130

## 2014-05-21 ENCOUNTER — Emergency Department (HOSPITAL_COMMUNITY): Payer: Medicaid Other

## 2014-05-21 ENCOUNTER — Encounter (HOSPITAL_COMMUNITY): Payer: Self-pay | Admitting: Emergency Medicine

## 2014-05-21 ENCOUNTER — Emergency Department (HOSPITAL_COMMUNITY)
Admission: EM | Admit: 2014-05-21 | Discharge: 2014-05-21 | Disposition: A | Discharge status: Home / Self Care | Payer: Self-pay | Attending: Emergency Medicine | Admitting: Emergency Medicine

## 2014-05-21 DIAGNOSIS — R109 Unspecified abdominal pain: Secondary | ICD-10-CM

## 2014-05-21 DIAGNOSIS — R110 Nausea: Secondary | ICD-10-CM | POA: Insufficient documentation

## 2014-05-21 DIAGNOSIS — R1030 Lower abdominal pain, unspecified: Secondary | ICD-10-CM | POA: Insufficient documentation

## 2014-05-21 DIAGNOSIS — R7989 Other specified abnormal findings of blood chemistry: Secondary | ICD-10-CM

## 2014-05-21 LAB — COMPREHENSIVE METABOLIC PANEL
ALK PHOS: 85 U/L (ref 39–117)
ALT: 32 U/L (ref 0–53)
ANION GAP: 9 (ref 5–15)
AST: 34 U/L (ref 0–37)
Albumin: 4.2 g/dL (ref 3.5–5.2)
BUN: 19 mg/dL (ref 6–23)
CO2: 25 mmol/L (ref 19–32)
Calcium: 9.7 mg/dL (ref 8.4–10.5)
Chloride: 101 mmol/L (ref 96–112)
Creatinine, Ser: 1.6 mg/dL — ABNORMAL HIGH (ref 0.50–1.35)
GFR calc non Af Amer: 58 mL/min — ABNORMAL LOW (ref >90)
GFR, EST AFRICAN AMERICAN: 67 mL/min — AB (ref >90)
GLUCOSE: 106 mg/dL — AB (ref 70–99)
POTASSIUM: 3.8 mmol/L (ref 3.5–5.1)
Sodium: 135 mmol/L (ref 135–145)
TOTAL PROTEIN: 7 g/dL (ref 6.0–8.3)
Total Bilirubin: 0.7 mg/dL (ref 0.3–1.2)

## 2014-05-21 LAB — CBC WITH DIFFERENTIAL/PLATELET
Basophils Absolute: 0 10*3/uL (ref 0.0–0.1)
Basophils Relative: 0 % (ref 0–1)
EOS ABS: 0.1 10*3/uL (ref 0.0–0.7)
Eosinophils Relative: 1 % (ref 0–5)
HCT: 42.2 % (ref 39.0–52.0)
Hemoglobin: 15.3 g/dL (ref 13.0–17.0)
LYMPHS PCT: 11 % — AB (ref 12–46)
Lymphs Abs: 1.2 10*3/uL (ref 0.7–4.0)
MCH: 30.1 pg (ref 26.0–34.0)
MCHC: 36.3 g/dL — AB (ref 30.0–36.0)
MCV: 82.9 fL (ref 78.0–100.0)
MONOS PCT: 11 % (ref 3–12)
Monocytes Absolute: 1.2 10*3/uL — ABNORMAL HIGH (ref 0.1–1.0)
Neutro Abs: 9.1 10*3/uL — ABNORMAL HIGH (ref 1.7–7.7)
Neutrophils Relative %: 78 % — ABNORMAL HIGH (ref 43–77)
PLATELETS: 191 10*3/uL (ref 150–400)
RBC: 5.09 MIL/uL (ref 4.22–5.81)
RDW: 13 % (ref 11.5–15.5)
WBC: 11.4 10*3/uL — AB (ref 4.0–10.5)

## 2014-05-21 LAB — URINALYSIS, ROUTINE W REFLEX MICROSCOPIC
Bilirubin Urine: NEGATIVE
GLUCOSE, UA: NEGATIVE mg/dL
KETONES UR: NEGATIVE mg/dL
LEUKOCYTES UA: NEGATIVE
NITRITE: NEGATIVE
Protein, ur: NEGATIVE mg/dL
Specific Gravity, Urine: 1.005 (ref 1.005–1.030)
Urobilinogen, UA: 0.2 mg/dL (ref 0.0–1.0)
pH: 6 (ref 5.0–8.0)

## 2014-05-21 LAB — URINE MICROSCOPIC-ADD ON

## 2014-05-21 MED ORDER — NAPROXEN 500 MG PO TABS: 500.0000 mg | ORAL_TABLET | Freq: Two times a day (BID) | ORAL | Status: DC

## 2014-05-21 MED ORDER — TRAMADOL HCL 50 MG PO TABS: 50.0000 mg | ORAL_TABLET | Freq: Four times a day (QID) | ORAL | Status: DC | PRN

## 2014-05-21 MED ORDER — SODIUM CHLORIDE 0.9 % IV BOLUS (SEPSIS)
1000.0000 mL | Freq: Once | INTRAVENOUS | Status: AC
  Administered 2014-05-21: 1000 mL via INTRAVENOUS

## 2014-05-21 MED ORDER — ONDANSETRON HCL 4 MG/2ML IJ SOLN
4.0000 mg | Freq: Once | INTRAMUSCULAR | Status: AC
  Administered 2014-05-21: 4 mg via INTRAVENOUS
  Filled 2014-05-21: qty 2

## 2014-05-21 MED ORDER — HYDROMORPHONE HCL 1 MG/ML IJ SOLN
0.5000 mg | Freq: Once | INTRAMUSCULAR | Status: AC
  Administered 2014-05-21: 0.5 mg via INTRAVENOUS
  Filled 2014-05-21: qty 1

## 2014-05-21 MED ORDER — KETOROLAC TROMETHAMINE 30 MG/ML IJ SOLN
30.0000 mg | Freq: Once | INTRAMUSCULAR | Status: AC
  Administered 2014-05-21: 30 mg via INTRAVENOUS
  Filled 2014-05-21: qty 1

## 2014-05-21 NOTE — Discharge Instructions (Signed)
Flank Pain °Flank pain refers to pain that is located on the side of the body between the upper abdomen and the back. The pain may occur over a short period of time (acute) or may be long-term or reoccurring (chronic). It may be mild or severe. Flank pain can be caused by many things. °CAUSES  °Some of the more common causes of flank pain include: °· Muscle strains.   °· Muscle spasms.   °· A disease of your spine (vertebral disk disease).   °· A lung infection (pneumonia).   °· Fluid around your lungs (pulmonary edema).   °· A kidney infection.   °· Kidney stones.   °· A very painful skin rash caused by the chickenpox virus (shingles).   °· Gallbladder disease.   °HOME CARE INSTRUCTIONS  °Home care will depend on the cause of your pain. In general, °· Rest as directed by your caregiver. °· Drink enough fluids to keep your urine clear or pale yellow. °· Only take over-the-counter or prescription medicines as directed by your caregiver. Some medicines may help relieve the pain. °· Tell your caregiver about any changes in your pain. °· Follow up with your caregiver as directed. °SEEK IMMEDIATE MEDICAL CARE IF:  °· Your pain is not controlled with medicine.   °· You have new or worsening symptoms. °· Your pain increases.   °· You have abdominal pain.   °· You have shortness of breath.   °· You have persistent nausea or vomiting.   °· You have swelling in your abdomen.   °· You feel faint or pass out.   °· You have blood in your urine. °· You have a fever or persistent symptoms for more than 2-3 days. °· You have a fever and your symptoms suddenly get worse. °MAKE SURE YOU:  °· Understand these instructions. °· Will watch your condition. °· Will get help right away if you are not doing well or get worse. °Document Released: 05/12/2005 Document Revised: 12/14/2011 Document Reviewed: 11/03/2011 °ExitCare® Patient Information ©2015 [employer]. This information is not intended to replace advice given to you by your  health care provider. Make sure you discuss any questions you have with your health care provider. ° °

## 2014-05-21 NOTE — ED Provider Notes (Signed)
CSN: [id number]     Arrival date & time 05/21/14  0113 History   First MD Initiated Contact with Patient 05/21/14 0211     Chief Complaint  Patient presents with  . Flank Pain    (Consider location/radiation/quality/duration/timing/severity/associated sxs/prior Treatment) HPI Comments: Patient is a 29 year old male with no significant past medical history who presents to the emergency department for further evaluation of flank pain. Patient states that he began to notice the pain at 1800 yesterday. Patient was fairly constant, but dull at this time. Patient states that pain awoke her from sleep at 2300 and was much more severe. He describes the pain as throbbing and radiating to his suprapubic region. He denies any modifying factors of his symptoms and reports associated nausea. Patient denies fever, dysuria, hematuria, vomiting, diarrhea, melena or hematochezia, penile discharge, scrotal redness or swelling, and penile redness or swelling. His last bowel movement was yesterday morning which was normal for him. Abdominal surgical history significant for appendectomy. Patient's mother and father both with hx of kidney stones.  Patient is a 29 y.o. male presenting with flank pain. The history is provided by the patient. No language interpreter was used.  Flank Pain Associated symptoms include nausea.    Past Medical History  Diagnosis Date  . Abscess    Past Surgical History  Procedure Laterality Date  . Appendectomy     No family history on file. History  Substance Use Topics  . Smoking status: Current Every Day Smoker -- 1.00 packs/day for 5 years    Types: Cigarettes  . Smokeless tobacco: Never Used  . Alcohol Use: Yes    Review of Systems  Gastrointestinal: Positive for nausea.  Genitourinary: Positive for flank pain. Negative for dysuria and hematuria.  All other systems reviewed and are negative.   Allergies  Morphine and related and Vicodin  Home Medications   Prior  to Admission medications   Medication Sig Start Date End Date Taking? Authorizing Provider  Aspirin-Acetaminophen-Caffeine (GOODY HEADACHE PO) Take 1 packet by mouth 2 (two) times daily as needed (for pain).   Yes Historical Provider, MD  amoxicillin (AMOXIL) 500 MG capsule Take 1 capsule (500 mg total) by mouth 3 (three) times daily. Patient not taking: Reported on 05/21/2014 03/31/14   Dorthula Matasiffany G Greene, PA-C  diazepam (VALIUM) 5 MG tablet Take 1 tablet (5 mg total) by mouth every 12 (twelve) hours as needed (Take 1-2 pills every 8-12 hours as needed for back pain). Patient not taking: Reported on 05/21/2014 10/04/12   Junius FinnerErin O'Malley, PA-C  doxycycline (ADOXA) 100 MG tablet Take 100 mg by mouth 2 (two) times daily. X 10 days     Historical Provider, MD  FLUoxetine (PROZAC) 20 MG capsule Take 20 mg by mouth daily.      Historical Provider, MD  ibuprofen (ADVIL,MOTRIN) 800 MG tablet Take 1 tablet (800 mg total) by mouth 3 (three) times daily. Patient not taking: Reported on 05/21/2014 10/04/12   Junius FinnerErin O'Malley, PA-C  naproxen (NAPROSYN) 500 MG tablet Take 1 tablet (500 mg total) by mouth 2 (two) times daily. 05/21/14   Antony MaduraKelly Cherity Blickenstaff, PA-C  propoxyphene-acetaminophen (DARVOCET-N 100) 100-650 MG per tablet Take 1 tablet by mouth every 4 (four) hours as needed.      Historical Provider, MD  traMADol (ULTRAM) 50 MG tablet Take 1 tablet (50 mg total) by mouth every 6 (six) hours as needed for severe pain. 05/21/14   Antony MaduraKelly Jalyric Kaestner, PA-C   BP 119/63 mmHg  Pulse 69  Temp(Src) 98.5 F (36.9 C) (Oral)  Resp 16  Ht  (1.803 m)  Wt 176 lb (79.833 kg)  BMI 24.56 kg/m2  SpO2 94%   Physical Exam  Constitutional: He is oriented to person, place, and time. He appears well-developed and well-nourished. No distress.  Nontoxic/nonseptic appearing  HENT:  Head: Normocephalic and atraumatic.  Eyes: Conjunctivae and EOM are normal. No scleral icterus.  Neck: Normal range of motion.  Cardiovascular: Normal rate,  regular rhythm and normal heart sounds.   Pulmonary/Chest: Effort normal and breath sounds normal. No respiratory distress. He has no wheezes. He has no rales.  Respirations even and unlabored  Abdominal: Soft. He exhibits no distension. There is tenderness. There is no rebound and no guarding.  Mild L CVA TTP with TTP also in b/l lower quadrants. Abdomen soft. No involuntary guarding or peritoneal signs.  Musculoskeletal: Normal range of motion.  Neurological: He is alert and oriented to person, place, and time. He exhibits normal muscle tone. Coordination normal.  Skin: Skin is warm and dry. No rash noted. He is not diaphoretic. No erythema. No pallor.  Psychiatric: He has a normal mood and affect. His behavior is normal.  Nursing note and vitals reviewed.   ED Course  Procedures (including critical care time) Labs Review Labs Reviewed  URINALYSIS, ROUTINE W REFLEX MICROSCOPIC - Abnormal; Notable for the following:    Hgb urine dipstick LARGE (*)    All other components within normal limits  CBC WITH DIFFERENTIAL/PLATELET - Abnormal; Notable for the following:    WBC 11.4 (*)    MCHC 36.3 (*)    Neutrophils Relative % 78 (*)    Neutro Abs 9.1 (*)    Lymphocytes Relative 11 (*)    Monocytes Absolute 1.2 (*)    All other components within normal limits  COMPREHENSIVE METABOLIC PANEL - Abnormal; Notable for the following:    Glucose, Bld 106 (*)    Creatinine, Ser 1.60 (*)    GFR calc non Af Amer 58 (*)    GFR calc Af Amer 67 (*)    All other components within normal limits  URINE MICROSCOPIC-ADD ON    Imaging Review Ct Renal Stone Study  05/21/2014   CLINICAL DATA:  Bilateral flank and groin pain starting around 7:30 p.m.  EXAM: CT ABDOMEN AND PELVIS WITHOUT CONTRAST  TECHNIQUE: Multidetector CT imaging of the abdomen and pelvis was performed following the standard protocol without IV contrast.  COMPARISON:  09/22/2006  FINDINGS: There are no urinary calculi. There is no  hydronephrosis or ureteral dilatation. There are unremarkable unenhanced appearances of the liver, spleen, pancreas and adrenals. There is prior appendectomy. There are otherwise unremarkable appearances of the mesentery and bowel. There is no ascites. No acute inflammatory changes are evident in the abdomen or pelvis. No significant abnormalities are evident in the lower chest. No significant musculoskeletal abnormalities are evident.  IMPRESSION: No significant abnormality   Electronically Signed   By: Ellery Plunk M.D.   On: 05/21/2014 03:24     EKG Interpretation None      MDM   Final diagnoses:  Flank pain  Elevated serum creatinine    29 year old male presents to the emergency department for further evaluation of flank pain with onset yesterday evening. Pain has been constant since onset, but has spontaneously improved since arrival. Physical exam as above. Patient exhibits no peritoneal signs. History and physical exam non-concerning for cauda equina. Patient noted to have a mild leukocytosis of 11.4. Kidney  function also worsened compared to prior values. Creatinine 1.6 today. Baseline appears to be approximately 0.9-1, one month ago. Small amount of microscopic hematuria; 3-6 RBCs noted.  CT abdomen pelvis completed to evaluate for kidney stone. Patient denies a history of kidney stones, but he does report a family history of this. CT negative for any acute intra-abdominal pathology. Residual pain has resolved since being given Toradol, Dilaudid, and IV fluids.  Suspect that symptoms may be musculoskeletal in nature, though cannot exclude a recently passed kidney stone. Transient hydronephrosis from a kidney stone would explain a worsening in the patient's kidney function; sudden onset of pain and microscopic hematuria also are very c/w this diagnosis. He exhibits no hydronephrosis on his imaging today.  As patient is pain-free, nontoxic/nonseptic appearing, and afebrile with a  reassuring workup, do not believe further emergent imaging or monitoring is indicated at this time. Patient to be treated as an outpatient with naproxen and tramadol. Have also advised primary care follow-up for recheck of his kidney function. Return precautions discussed and provided. Patient agreeable to plan with no unaddressed concerns. Patient discharged in good condition; VSS.   Filed Vitals:   05/21/14 0415 05/21/14 0430 05/21/14 0445 05/21/14 0500  BP: 136/80 121/62 126/63 119/63  Pulse: 62 58 73 69  Temp:  98.5 F (36.9 C)    TempSrc:  Oral    Resp:  16    Height:      Weight:      SpO2: 95% 96% 93% 94%     Antony Madura, PA-C 05/23/14 0557  Linwood Dibbles, MD 05/26/14 [phone]

## 2014-05-21 NOTE — ED Notes (Signed)
Family at bedside. 

## 2014-05-21 NOTE — ED Notes (Signed)
Pt. reports bilateral flank pain more at right side onset last night , denies dysuria or hematuria , mild nausea , no emesis or fever .

## 2014-05-21 NOTE — ED Notes (Signed)
Patient is alert and orientedx4.  Patient was explained discharge instructions and they understood them with no questions.  The patient's family is taking the patient home. 

## 2014-07-29 ENCOUNTER — Ambulatory Visit (INDEPENDENT_AMBULATORY_CARE_PROVIDER_SITE_OTHER): Payer: Self-pay | Admitting: Family Medicine

## 2014-07-29 ENCOUNTER — Encounter: Payer: Self-pay | Admitting: Family Medicine

## 2014-07-29 VITALS — BP 127/74 | HR 76 | Temp 98.0°F | Ht 71.0 in | Wt 166.4 lb

## 2014-07-29 DIAGNOSIS — F329 Major depressive disorder, single episode, unspecified: Secondary | ICD-10-CM

## 2014-07-29 DIAGNOSIS — F17200 Nicotine dependence, unspecified, uncomplicated: Secondary | ICD-10-CM

## 2014-07-29 DIAGNOSIS — R748 Abnormal levels of other serum enzymes: Secondary | ICD-10-CM

## 2014-07-29 DIAGNOSIS — F418 Other specified anxiety disorders: Secondary | ICD-10-CM

## 2014-07-29 DIAGNOSIS — L720 Epidermal cyst: Secondary | ICD-10-CM | POA: Insufficient documentation

## 2014-07-29 DIAGNOSIS — F419 Anxiety disorder, unspecified: Secondary | ICD-10-CM

## 2014-07-29 DIAGNOSIS — Z720 Tobacco use: Secondary | ICD-10-CM

## 2014-07-29 DIAGNOSIS — R7989 Other specified abnormal findings of blood chemistry: Secondary | ICD-10-CM | POA: Insufficient documentation

## 2014-07-29 DIAGNOSIS — R109 Unspecified abdominal pain: Secondary | ICD-10-CM

## 2014-07-29 LAB — POCT URINALYSIS DIPSTICK
BILIRUBIN UA: NEGATIVE
Blood, UA: NEGATIVE
Glucose, UA: NEGATIVE
KETONES UA: NEGATIVE
Leukocytes, UA: NEGATIVE
Nitrite, UA: NEGATIVE
PH UA: 6
Protein, UA: NEGATIVE
Spec Grav, UA: 1.025
Urobilinogen, UA: 0.2

## 2014-07-29 MED ORDER — CITALOPRAM HYDROBROMIDE 10 MG PO TABS: 10.0000 mg | ORAL_TABLET | Freq: Every day | ORAL | Status: DC

## 2014-07-29 NOTE — Assessment & Plan Note (Addendum)
Likely due to passage of a kidney stone prior to ED arrival. UA in office today negative for hematuria. - repeat BMP at next visit - encouraged hydration and low-salt diet to prevent formation of additional stones   Timothy Hess, MS3  I agree with the medical student assessment and plan. -isolated elevated Cr likely due to kidney stone -recheck BMP after patient has received "oragne card" which will cover the cost of labs

## 2014-07-29 NOTE — Assessment & Plan Note (Addendum)
Stable epidermal cyst on left superolateral neck that measures 2cm x 1cm.  - patient is interested in removal, will ultrasound and discuss options at next visit - lack of insurance is a barrier to ENT referral  Alfonse FlavorsLouisa Keiondre Colee, MS3  I agree with the medical student assessment and plan.  -would refer to ENT however patient has no insurance -discuss case with Dr. Denny LevySara Neal and consider removal with help of US  Donnella ShamKyle Fletke MD

## 2014-07-29 NOTE — Assessment & Plan Note (Addendum)
Patient reports exacerbation of depression in the last few years with recent worsening symptoms due to death of niece. New onset panic attacks in the past few months. No SI/HI. PHQ-9 score of 12 indicates moderate depression - start Celexa 10mg  in the morning - follow-up in 2-3 weeks - if patient does not tolerate Celexa, will change to Prozac since noted to be efficacious in the past  Alfonse FlavorsLouisa Tayson Schnelle, MS3  I agree with the medical student assessment and plan. History and exam consistent with depression with anxiety.  -start Celexa 10 mg daily -follow up in 2 weeks -check TSH and UDS at follow up visits (not completed today as patient does not have insurance).  -have patient apply for "orange card" to have lab work completed -return precautions discussed with patient.

## 2014-07-29 NOTE — Patient Instructions (Signed)
It was nice to meet you.  Depression and Anxiety - please start Celexa 10 mg daily, please call the office if you experience any thoughts of harming yourself or others, please continue to participate in activities that you find pleasure in  Kidney Stone - resolved, see below  Epidermal Cyst - Dr. Randolm Idol will discuss with his colleague and will arrange for removal  Kidney Stones Kidney stones (urolithiasis) are deposits that form inside your kidneys. The intense pain is caused by the stone moving through the urinary tract. When the stone moves, the ureter goes into spasm around the stone. The stone is usually passed in the urine.  CAUSES   A disorder that makes certain neck glands produce too much parathyroid hormone (primary hyperparathyroidism).  A buildup of uric acid crystals, similar to gout in your joints.  Narrowing (stricture) of the ureter.  A kidney obstruction present at birth (congenital obstruction).  Previous surgery on the kidney or ureters.  Numerous kidney infections. SYMPTOMS   Feeling sick to your stomach (nauseous).  Throwing up (vomiting).  Blood in the urine (hematuria).  Pain that usually spreads (radiates) to the groin.  Frequency or urgency of urination. DIAGNOSIS   Taking a history and physical exam.  Blood or urine tests.  CT scan.  Occasionally, an examination of the inside of the urinary bladder (cystoscopy) is performed. TREATMENT   Observation.  Increasing your fluid intake.  Extracorporeal shock wave lithotripsy--This is a noninvasive procedure that uses shock waves to break up kidney stones.  Surgery may be needed if you have severe pain or persistent obstruction. There are various surgical procedures. Most of the procedures are performed with the use of small instruments. Only small incisions are needed to accommodate these instruments, so recovery time is minimized. The size, location, and chemical composition are all important  variables that will determine the proper choice of action for you. Talk to your health care provider to better understand your situation so that you will minimize the risk of injury to yourself and your kidney.  HOME CARE INSTRUCTIONS   Drink enough water and fluids to keep your urine clear or pale yellow. This will help you to pass the stone or stone fragments.  Strain all urine through the provided strainer. Keep all particulate matter and stones for your health care provider to see. The stone causing the pain may be as small as a grain of salt. It is very important to use the strainer each and every time you pass your urine. The collection of your stone will allow your health care provider to analyze it and verify that a stone has actually passed. The stone analysis will often identify what you can do to reduce the incidence of recurrences.  Only take over-the-counter or prescription medicines for pain, discomfort, or fever as directed by your health care provider.  Make a follow-up appointment with your health care provider as directed.  Get follow-up X-rays if required. The absence of pain does not always mean that the stone has passed. It may have only stopped moving. If the urine remains completely obstructed, it can cause loss of kidney function or even complete destruction of the kidney. It is your responsibility to make sure X-rays and follow-ups are completed. Ultrasounds of the kidney can show blockages and the status of the kidney. Ultrasounds are not associated with any radiation and can be performed easily in a matter of minutes. SEEK MEDICAL CARE IF:  You experience pain that is progressive and  unresponsive to any pain medicine you have been prescribed. SEEK IMMEDIATE MEDICAL CARE IF:   Pain cannot be controlled with the prescribed medicine.  You have a fever or shaking chills.  The severity or intensity of pain increases over 18 hours and is not relieved by pain medicine.  You  develop a new onset of abdominal pain.  You feel faint or pass out.  You are unable to urinate. MAKE SURE YOU:   Understand these instructions.  Will watch your condition.  Will get help right away if you are not doing well or get worse. Document Released: 03/21/2005 Document Revised: 11/21/2012 Document Reviewed: 08/22/2012 [facility] Patient Information 2015 [employer] [place] [place]. This information is not intended to replace advice given to you by your health care provider. Make sure you discuss any questions you have with your health care provider.

## 2014-07-29 NOTE — Progress Notes (Signed)
Subjective:     Patient ID: Timothy Hess, male   DOB: 05-14-1985, 29 y.o.   MRN: [id number] Written by: Alfonse Flavors, MS3  HPI Timothy Hess is a 29 year old male with a past medical history significant for depression who presents today to re-establish care with family medicine.  PMH/FH/SH/Allergies/Medications reviewed and updated. Reviewed new patient health history and placed in scan box.   Depression: Patient has experienced depressive symptoms throughout his life. The death of his grandparents 3 years ago brought on the symptoms which have increasingly worsened since. The recent death of his 77-year-old niece has contributed to a further worsening of symptoms. He describes frequent crying and an inability to enjoy things he used to enjoy like spending time with his daughters. Years ago he was on Prozac and reports symptom improvement. He has a strong family history of depression in his mother and grandparents. He has never been hospitalized for this depression. He denies SI/HI and notes that his daughters are the reason he wakes up everyday. He has a strong family support system. Denies history of staying up for days at a time. Denies periods of profound productivity.   Panic Attacks: Patient has noted panic attacks in the past few months. He describes "feeling like I was going to die", diaphoresis, and heart racing. One episode awoke him from sleep. He denies associated pain. He is unable to note a specific thought or event that caused initiation of the symptoms. He has noticed that the episodes occur in the evenings. He reports 2 episodes in the past month. He is unable to identify a specific trigger. Symptoms resolve spontaneously.   Epidermal Cyst: Diagnosed in the ED on 03/31/2014. The cyst has been stable in size since. He notes general discomfort, but no pain. He is interested in removal. Located under the left jaw.   Flank Pain: Presented to the ED on 05/21/2014 with hematuria and flank  pain. Creatinine was elevated. Imaging did not indicate hydronephrosis. Family history of nephrolithiasis in both parents. This visit was the first time experiencing the pain and he has had no pain since. He denies a color change in his urine, dysuria, and urinary frequency.   Social History: He lives with his girlfriend and two daughters (Timothy Hess and Timothy Hess, 61yrs and 20yrs). He is currently unemployed. He smokes 1.5 ppd and has done so for 10 years. He is not interested in quitting at this time. He drinks 5-6 beers occasionally on weeks. He has a history of marijuana and Roxicodone use. Denies current marijuana and narcotic use.   Review of Systems  Constitutional: Negative for fever, chills and fatigue.  Respiratory: Negative for cough and shortness of breath.   Cardiovascular: Negative for chest pain and leg swelling.  Gastrointestinal: Negative for nausea, vomiting and diarrhea.  Neurological: Negative for seizures, numbness and headaches.  Psychiatric/Behavioral: Positive for dysphoric mood. Negative for hallucinations. The patient is nervous/anxious.    Denies chest pain, shortness of breath, constipation, and dysuria or urinary frequency.     Objective:   Physical Exam BP 127/74 mmHg  Pulse 76  Temp(Src) 98 F (36.7 C) (Oral)  Ht  (1.803 m)  Wt 166 lb 6.4 oz (75.479 kg)  BMI 23.22 kg/m2 Gen: well-appearing male in NAD HEENT: Normocephalic, PERRLA, EOMI. Normal TMs bilaterally. Nasal septum midline, no rhinorrhea, MMM. Oropharynx clear.  Neck: no cervical or submandibular LAD. No thyromegaly. Cystic structure (no drainage, non-adhered) on superior lateral left neck measures 2cm x 1.5cm.  CV: RRR, no  MRG, nl S1 and S2. No edema Pulmonary: Lungs CTA bilaterally. No wheezes or crackles. Abdominal: normoactive bowel sounds, soft and nontender.  Skin: multiple tattoos Psych: appropriately dressed, mood is stated as depressed, affect is appropriate, no tangential thoughts, no flight  of ideas, does not appear to be responding to internal stimuli, no grandiosity  Reviewed recent ED note.     Assessment:     Please see Problem List.     Plan:     Please see Problem List.      I personally saw and evaluated the patient. I have reviewed the medical student note and agree with the documentation. I personally completed the physical exam. Additions to the medical student note are made in blue.   Donnella ShamKyle Fletke MD

## 2014-07-30 ENCOUNTER — Encounter: Payer: Self-pay | Admitting: Family Medicine

## 2014-07-30 NOTE — Assessment & Plan Note (Signed)
Patient not interested in cessation at this time -reassess at follow up visits.

## 2014-08-18 ENCOUNTER — Encounter: Payer: Self-pay | Admitting: Family Medicine

## 2014-08-18 ENCOUNTER — Ambulatory Visit (INDEPENDENT_AMBULATORY_CARE_PROVIDER_SITE_OTHER): Payer: Self-pay | Admitting: Family Medicine

## 2014-08-18 VITALS — BP 127/82 | HR 70 | Temp 97.9°F | Ht 71.0 in | Wt 164.4 lb

## 2014-08-18 DIAGNOSIS — Z720 Tobacco use: Secondary | ICD-10-CM

## 2014-08-18 DIAGNOSIS — R748 Abnormal levels of other serum enzymes: Secondary | ICD-10-CM

## 2014-08-18 DIAGNOSIS — F329 Major depressive disorder, single episode, unspecified: Secondary | ICD-10-CM

## 2014-08-18 DIAGNOSIS — F418 Other specified anxiety disorders: Secondary | ICD-10-CM

## 2014-08-18 DIAGNOSIS — R7989 Other specified abnormal findings of blood chemistry: Secondary | ICD-10-CM

## 2014-08-18 DIAGNOSIS — F17200 Nicotine dependence, unspecified, uncomplicated: Secondary | ICD-10-CM

## 2014-08-18 DIAGNOSIS — F419 Anxiety disorder, unspecified: Secondary | ICD-10-CM

## 2014-08-18 LAB — BASIC METABOLIC PANEL
BUN: 16 mg/dL (ref 6–23)
CHLORIDE: 102 meq/L (ref 96–112)
CO2: 25 mEq/L (ref 19–32)
Calcium: 10 mg/dL (ref 8.4–10.5)
Creat: 0.88 mg/dL (ref 0.50–1.35)
GLUCOSE: 77 mg/dL (ref 70–99)
POTASSIUM: 4.6 meq/L (ref 3.5–5.3)
SODIUM: 138 meq/L (ref 135–145)

## 2014-08-18 NOTE — Progress Notes (Signed)
   Subjective:    Patient ID: Carolyne LittlesJoshua S Demaree, male    DOB: 07/31/1985, 29 y.o.   MRN: [id number]  HPI 29 year old male presents for routine follow-up.  Anxiety and depression-patient was started on Celexa 10 mg daily at last visit, since last visit he reports significant improvement in his depressive symptoms, he occasionally still feels down at nighttime however this last only 1-2 hours and resolves, he reports improved sleep, improved concentration, decreased anhedonia, no homicidal or suicidal thoughts, improved energy. He denies any recent panic attacks.  History of elevated creatinine-patient denies any urinary symptoms, no hematuria, no flank pain, no dysuria  Tobacco abuse-patient currently smokes one half pack per day, not interested in smoking cessation at this time   Review of Systems  Constitutional: Negative for fever, chills and fatigue.  Respiratory: Negative for shortness of breath.   Cardiovascular: Negative for chest pain.  Psychiatric/Behavioral: Negative for hallucinations, confusion, sleep disturbance, dysphoric mood and decreased concentration. The patient is nervous/anxious.        Objective:   Physical Exam Vitals: Reviewed Gen.: Pleasant Caucasian male, no acute distress HEENT: Normocephalic, pupils are equal round and reactive to light, extraocular movements are intact, no scleral icterus, moist mucous members Cardiac: Regular rate and rhythm, S1 and S2 present, no murmurs, no heaves or thrills Respiratory: Clear to patient bilaterally, normal effort Psych: Smiling, improved affect, mood is less depressed, dressed appropriately, no hallucinations or delusions, not responding to internal stimuli, no tangential thoughts, no SI or no HI     Assessment & Plan:  Please see problem specific assessment and plan.

## 2014-08-18 NOTE — Assessment & Plan Note (Signed)
Check BMP to monitor creatinine

## 2014-08-18 NOTE — Assessment & Plan Note (Signed)
Significantly improved with Celexa 10 mg daily. -Continue current dose -Reevaluate in 4 weeks

## 2014-08-18 NOTE — Patient Instructions (Signed)
I am happy to hear that you are doing so well. Continue Celexa 10 mg daily. Return in 1 month for follow up.  Dr. Randolm IdolFletke will schedule you for removal of the cyst on your neck. He will also call you with your lab results.   Please let Dr. Randolm IdolFletke know if you would like to quit smoking.

## 2014-08-18 NOTE — Assessment & Plan Note (Signed)
Patient counseled on tobacco cessation. Not interested in quitting at this time.

## 2014-08-19 ENCOUNTER — Encounter: Payer: Self-pay | Admitting: Family Medicine

## 2014-08-19 DIAGNOSIS — R7989 Other specified abnormal findings of blood chemistry: Secondary | ICD-10-CM

## 2014-08-19 NOTE — Assessment & Plan Note (Signed)
BMP normal on 08/18/14.

## 2014-08-26 ENCOUNTER — Telehealth: Payer: Self-pay | Admitting: Family Medicine

## 2014-08-26 NOTE — Telephone Encounter (Signed)
Note to nursing staff - please schedule Mr. Timothy Hess the morning of June 6th or 7th for a 30 minute appointment (removal of cyst on neck), thanks

## 2014-08-27 NOTE — Telephone Encounter (Signed)
Pt informed and scheduled for Tuesday June 7th. Deseree Bruna PotterBlount, CMA

## 2014-09-09 ENCOUNTER — Ambulatory Visit (INDEPENDENT_AMBULATORY_CARE_PROVIDER_SITE_OTHER): Payer: Self-pay | Admitting: Family Medicine

## 2014-09-09 ENCOUNTER — Encounter: Payer: Self-pay | Admitting: Family Medicine

## 2014-09-09 VITALS — BP 132/68 | HR 63 | Temp 97.9°F | Ht 71.0 in | Wt 170.1 lb

## 2014-09-09 DIAGNOSIS — L720 Epidermal cyst: Secondary | ICD-10-CM

## 2014-09-09 NOTE — Assessment & Plan Note (Signed)
Epidermal cyst removed from left jaw line. See procedure note from 09/09/14.

## 2014-09-09 NOTE — Progress Notes (Signed)
   Subjective:    Patient ID: Timothy Hess, Timothy Hess    DOB: 08/07/1985, 29 y.o.   MRN: [id number]  HPI [address] Timothy Hess presents for removal of lipoma versus sebaceous cyst of the left jaw.   Review of Systems     Objective:   Physical Exam Skin: 2.5 cm x 2 cm soft movable cystic structure over the left mandible. No poor noted.  Ultrasound: Dr. Lloyd HugerNeil perform bedside ultrasound which identified approximately 2 cm x 1 cm cystic structure, no vascular flow was noted on Doppler examination  Procedure note: Cyst removal Written and verbal consent were obtained. The risks and benefits of the procedure were discussed with the patient. The area was prepped with alcohol. Approximately 5 mL of Xylocaine with epinephrine was injected superficially over the lesion using a 25-gauge 1-1/2 inch needle. The area was then cleansed using Betadine. Sterile gloves were donned. Using a 11 blade scalpel an approximately 1 cm incision was made superficially over the lesion along the skin lines. Hemostats and iris scissors were used to bluntly dissect around the cystic structure. A cheesy/purulent material was expressed from the cyst. The capsule was able to be removed in whole. 2 vertical mattress sutures were placed in the incision using 4-0 nylon suture. The patient tolerated the procedure well. No complications. Minimal blood loss. The area was covered with bacitracin and a Band-Aid.     Assessment & Plan:  Please see problem specific assessment and plan.

## 2014-09-09 NOTE — Patient Instructions (Addendum)
It was nice to see you today.  The area on your face was a sebaceous cyst.   Epidermal Cyst An epidermal cyst is sometimes called a sebaceous cyst, epidermal inclusion cyst, or infundibular cyst. These cysts usually contain a substance that looks "pasty" or "cheesy" and may have a bad smell. This substance is a protein called keratin. Epidermal cysts are usually found on the face, neck, or trunk. They may also occur in the vaginal area or other parts of the genitalia of both men and women. Epidermal cysts are usually small, painless, slow-growing bumps or lumps that move freely under the skin. It is important not to try to pop them. This may cause an infection and lead to tenderness and swelling. CAUSES  Epidermal cysts may be caused by a deep penetrating injury to the skin or a plugged hair follicle, often associated with acne. SYMPTOMS  Epidermal cysts can become inflamed and cause:  Redness.  Tenderness.  Increased temperature of the skin over the bumps or lumps.  Grayish-white, bad smelling material that drains from the bump or lump. DIAGNOSIS  Epidermal cysts are easily diagnosed by your caregiver during an exam. Rarely, a tissue sample (biopsy) may be taken to rule out other conditions that may resemble epidermal cysts. TREATMENT   Epidermal cysts often get better and disappear on their own. They are rarely ever cancerous.  If a cyst becomes infected, it may become inflamed and tender. This may require opening and draining the cyst. Treatment with antibiotics may be necessary. When the infection is gone, the cyst may be removed with minor surgery.  Small, inflamed cysts can often be treated with antibiotics or by injecting steroid medicines.  Sometimes, epidermal cysts become large and bothersome. If this happens, surgical removal in your caregiver's office may be necessary. HOME CARE INSTRUCTIONS  Only take over-the-counter or prescription medicines as directed by your  caregiver.  Take your antibiotics as directed. Finish them even if you start to feel better. SEEK MEDICAL CARE IF:   Your cyst becomes tender, red, or swollen.  Your condition is not improving or is getting worse.  You have any other questions or concerns. MAKE SURE YOU:  Understand these instructions.  Will watch your condition.  Will get help right away if you are not doing well or get worse. Document Released: 02/20/2004 Document Revised: 06/13/2011 Document Reviewed: 09/27/2010 [facility] Patient Information 2015 [employer] [place]. This information is not intended to replace advice given to you by your health care provider. Make sure you discuss any questions you have with your health care provider.

## 2014-09-12 ENCOUNTER — Ambulatory Visit: Payer: Self-pay | Admitting: Family Medicine

## 2014-09-23 ENCOUNTER — Encounter: Payer: Self-pay | Admitting: Family Medicine

## 2014-09-23 ENCOUNTER — Ambulatory Visit (INDEPENDENT_AMBULATORY_CARE_PROVIDER_SITE_OTHER): Payer: Medicaid Other | Admitting: Family Medicine

## 2014-09-23 VITALS — BP 126/72 | HR 54 | Temp 98.2°F | Ht 71.0 in | Wt 167.5 lb

## 2014-09-23 DIAGNOSIS — L720 Epidermal cyst: Secondary | ICD-10-CM

## 2014-09-23 DIAGNOSIS — F419 Anxiety disorder, unspecified: Principal | ICD-10-CM

## 2014-09-23 DIAGNOSIS — F329 Major depressive disorder, single episode, unspecified: Secondary | ICD-10-CM

## 2014-09-23 DIAGNOSIS — Z23 Encounter for immunization: Secondary | ICD-10-CM

## 2014-09-23 DIAGNOSIS — F418 Other specified anxiety disorders: Secondary | ICD-10-CM

## 2014-09-23 MED ORDER — CITALOPRAM HYDROBROMIDE 10 MG PO TABS: 10.0000 mg | ORAL_TABLET | Freq: Every day | ORAL | Status: AC

## 2014-09-23 NOTE — Assessment & Plan Note (Signed)
Improved on Celexa 10 mg daily -continue current therapy.  

## 2014-09-23 NOTE — Patient Instructions (Signed)
It was nice to see you today.  A refill of your Celexa was sent to the pharmacy.   You were give a tetanus shot today.   Follow up in 2 months.

## 2014-09-23 NOTE — Progress Notes (Signed)
   Subjective:    Patient ID: Timothy Hess, male    DOB: March 30, 1986, 30 y.o.   MRN: [id number]  HPI 29 y/o male presents for follow up.  Sebaceous cyst removal - healing well, patient removed sutures by himself, no redness/pain/discharge  Depression - patient taking Celexa 10 mg daily, tolerating well, no side effects, mood is greatly improved, no anhedonia, sleeping well, good concentration, no SI or HI, did have one small panic attack last week that resolved after short period of time (awoke him from sleep).    Review of Systems  Constitutional: Negative for fever, chills and fatigue.  Respiratory: Negative for shortness of breath.   Cardiovascular: Negative for chest pain.       Objective:   Physical Exam Vitals: reviewed Gen: pleasant male, NAD Skin: well healed sebaceous cyst incision site on left jaw Psych: appropriately dressed, affect is outgoing, smiling, no SI, no HI, not responding to internal stimuli       Assessment & Plan:  Please see problem specific assessment and plan.

## 2014-09-23 NOTE — Assessment & Plan Note (Signed)
Incision site healing well. Patient removed sutures on own.

## 2014-11-10 ENCOUNTER — Ambulatory Visit: Payer: Self-pay | Admitting: Family Medicine

## 2016-10-21 ENCOUNTER — Emergency Department (HOSPITAL_COMMUNITY)
Admission: EM | Admit: 2016-10-21 | Discharge: 2016-10-21 | Disposition: A | Discharge status: Home / Self Care | Payer: Self-pay | Attending: Emergency Medicine | Admitting: Emergency Medicine

## 2016-10-21 DIAGNOSIS — F17210 Nicotine dependence, cigarettes, uncomplicated: Secondary | ICD-10-CM | POA: Insufficient documentation

## 2016-10-21 DIAGNOSIS — R112 Nausea with vomiting, unspecified: Secondary | ICD-10-CM | POA: Insufficient documentation

## 2016-10-21 DIAGNOSIS — R197 Diarrhea, unspecified: Secondary | ICD-10-CM | POA: Insufficient documentation

## 2016-10-21 LAB — CBC
HCT: 48.1 % (ref 39.0–52.0)
Hemoglobin: 17.8 g/dL — ABNORMAL HIGH (ref 13.0–17.0)
MCH: 30.2 pg (ref 26.0–34.0)
MCHC: 37 g/dL — ABNORMAL HIGH (ref 30.0–36.0)
MCV: 81.7 fL (ref 78.0–100.0)
Platelets: 108 10*3/uL — ABNORMAL LOW (ref 150–400)
RBC: 5.89 MIL/uL — ABNORMAL HIGH (ref 4.22–5.81)
RDW: 13.5 % (ref 11.5–15.5)
WBC: 17.2 10*3/uL — ABNORMAL HIGH (ref 4.0–10.5)

## 2016-10-21 LAB — COMPREHENSIVE METABOLIC PANEL
ALT: 28 U/L (ref 17–63)
AST: 36 U/L (ref 15–41)
Albumin: 5.1 g/dL — ABNORMAL HIGH (ref 3.5–5.0)
Alkaline Phosphatase: 90 U/L (ref 38–126)
Anion gap: 14 (ref 5–15)
BUN: 17 mg/dL (ref 6–20)
CO2: 23 mmol/L (ref 22–32)
Calcium: 10.2 mg/dL (ref 8.9–10.3)
Chloride: 102 mmol/L (ref 101–111)
Creatinine, Ser: 1.13 mg/dL (ref 0.61–1.24)
GFR calc Af Amer: >60 mL/min (ref >60)
GFR calc non Af Amer: >60 mL/min (ref >60)
Glucose, Bld: 114 mg/dL — ABNORMAL HIGH (ref 65–99)
Potassium: 4 mmol/L (ref 3.5–5.1)
Sodium: 139 mmol/L (ref 135–145)
Total Bilirubin: 1.1 mg/dL (ref 0.3–1.2)
Total Protein: 9.4 g/dL — ABNORMAL HIGH (ref 6.5–8.1)

## 2016-10-21 LAB — URINALYSIS, ROUTINE W REFLEX MICROSCOPIC
Bacteria, UA: NONE SEEN
Glucose, UA: NEGATIVE mg/dL
Ketones, ur: 20 mg/dL — AB
Leukocytes, UA: NEGATIVE
Nitrite: NEGATIVE
Protein, ur: 30 mg/dL — AB
Specific Gravity, Urine: 1.031 — ABNORMAL HIGH (ref 1.005–1.030)
pH: 5 (ref 5.0–8.0)

## 2016-10-21 LAB — LIPASE, BLOOD: Lipase: 16 U/L (ref 11–51)

## 2016-10-21 MED ORDER — ONDANSETRON 4 MG PO TBDP
4.0000 mg | ORAL_TABLET | Freq: Once | ORAL | Status: AC | PRN
  Administered 2016-10-21: 4 mg via ORAL
  Filled 2016-10-21: qty 1

## 2016-10-21 MED ORDER — SODIUM CHLORIDE 0.9 % IV BOLUS (SEPSIS): 1000.0000 mL | Freq: Once | INTRAVENOUS | Status: DC

## 2016-10-21 MED ORDER — ONDANSETRON HCL 4 MG PO TABS: 4.0000 mg | ORAL_TABLET | Freq: Four times a day (QID) | ORAL | 0 refills | Status: AC

## 2016-10-21 MED ORDER — SODIUM CHLORIDE 0.9 % IV BOLUS (SEPSIS)
1000.0000 mL | Freq: Once | INTRAVENOUS | Status: AC
  Administered 2016-10-21: 1000 mL via INTRAVENOUS

## 2016-10-21 MED ORDER — ONDANSETRON HCL 4 MG/2ML IJ SOLN
4.0000 mg | Freq: Once | INTRAMUSCULAR | Status: AC
  Administered 2016-10-21: 4 mg via INTRAVENOUS
  Filled 2016-10-21: qty 2

## 2016-10-21 NOTE — ED Notes (Signed)
Pt had drawn in triage Gold  Lavender Lt green Dark green 

## 2016-10-21 NOTE — ED Provider Notes (Signed)
[facility]-EMERGENCY DEPT Provider Note   CSN: [id number] Arrival date & time: 10/21/16  1549  By signing my name below, I, Rosario AdieWilliam Andrew Hiatt, attest that this documentation has been prepared under the direction and in the presence of Raeford RazorKohut, Eamon Tantillo, MD. Electronically Signed: Rosario AdieWilliam Andrew Hiatt, ED Scribe. 10/21/16. 5:48 PM.  History   Chief Complaint Chief Complaint  Patient presents with  . Emesis  . Abdominal Pain   The history is provided by the patient and the spouse. No language interpreter was used.    HPI Comments: Timothy Hess is a 31 y.o. male with no pertinent PMHx, who presents to the Emergency Department complaining of sudden onset of nausea, vomiting, and diarrhea beginning this afternoon prior to arrival. 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 He reports that initially the contents of his vomitus was initially food contents; however, this later turned to bright-yellow and some streaky blood. He also notes that he had some diarrhea as well with this. Pt reports that he was not performing any abnormal activities at work today and he was otherwise feeling at this baseline this morning prior to the onset of his symptoms. Additionally, he states that while in the ED he is feeling back towards his baseline. Pt states that this morning he only drank a soft drink and energy drink prior to beginning working. Pt reports that he does typically take Goody Powders once a week; however, his wife states that he usually takes this 4-5 times a week, sometimes multiple times a day.  He denies abdominal pain, syncope, myalgias, or any other associated symptoms.   Past Medical History:  Diagnosis Date  . Abscess    Patient Active Problem List   Diagnosis Date Noted  . Epidermal cyst of neck 07/29/2014  . TOBACCO USER 01/10/2009  . Anxiety and depression 09/20/2006  . ATTENTION DEFICIT, W/HYPERACTIVITY  06/01/2006  . ECZEMA, ATOPIC DERMATITIS 06/01/2006   Past Surgical History:  Procedure Laterality Date  . APPENDECTOMY      Home Medications    Prior to Admission medications   Medication Sig Start Date End Date Taking? Authorizing Provider  Aspirin-Acetaminophen-Caffeine (GOODY HEADACHE PO) Take 1 packet by mouth 2 (two) times daily as needed (for pain).   Yes [provider]  citalopram (CELEXA) 10 MG tablet Take 1 tablet (10 mg total) by mouth daily. Patient not taking: Reported on 10/21/2016 09/23/14   Uvaldo RisingFletke, Kyle J, MD   Family History Family History  Problem Relation Age of Onset  . Hyperlipidemia Mother   . Hypertension Mother   . Depression Mother   . Cancer Maternal Grandmother   . Cancer Maternal Grandfather    Social History Social History  Substance Use Topics  . Smoking status: Current Every Day Smoker    Packs/day: 1.50    Years: 10.00    Types: Cigarettes  . Smokeless tobacco: Never Used  . Alcohol use No   Allergies   Patient has no known allergies.  Review of Systems Review of Systems  Gastrointestinal: Positive for diarrhea, nausea and vomiting. Negative for abdominal pain.  Musculoskeletal: Negative for myalgias.  Neurological: Negative for syncope.  All other systems reviewed and are negative.  Physical Exam Updated Vital Signs BP 118/82 (BP Location: Right Arm)   Pulse 95   Temp 98.5 F (36.9 C)   Resp 18   SpO2 100%   Physical Exam  Constitutional: He appears  well-developed and well-nourished.  HENT:  Head: Normocephalic.  Right Ear: External ear normal.  Left Ear: External ear normal.  Nose: Nose normal.  Eyes: Conjunctivae are normal. Right eye exhibits no discharge. Left eye exhibits no discharge.  Neck: Normal range of motion.  Cardiovascular: Normal rate, regular rhythm and normal heart sounds.   No murmur heard. Pulmonary/Chest: Effort normal and breath sounds normal. No respiratory distress. He has no wheezes. He  has no rales.  Abdominal: Soft. There is no tenderness. There is no rebound and no guarding.  Musculoskeletal: Normal range of motion. He exhibits no edema or tenderness.  Neurological: He is alert. No cranial nerve deficit. Coordination normal.  Skin: Skin is warm and dry. No rash noted. No erythema. No pallor.  Psychiatric: He has a normal mood and affect. His behavior is normal.  Nursing note and vitals reviewed.  ED Treatments / Results  DIAGNOSTIC STUDIES: Oxygen Saturation is 100% on RA, normal by my interpretation.   COORDINATION OF CARE: 5:47 PM-Discussed next steps with pt. Pt verbalized understanding and is agreeable with the plan.   Labs (all labs ordered are listed, but only abnormal results are displayed) Labs Reviewed  COMPREHENSIVE METABOLIC PANEL - Abnormal; Notable for the following:       Result Value   Glucose, Bld 114 (*)    Total Protein 9.4 (*)    Albumin 5.1 (*)    All other components within normal limits  CBC - Abnormal; Notable for the following:    WBC 17.2 (*)    RBC 5.89 (*)    Hemoglobin 17.8 (*)    MCHC 37.0 (*)    Platelets 108 (*)    All other components within normal limits  URINALYSIS, ROUTINE W REFLEX MICROSCOPIC - Abnormal; Notable for the following:    Color, Urine AMBER (*)    APPearance HAZY (*)    Specific Gravity, Urine 1.031 (*)    Hgb urine dipstick LARGE (*)    Bilirubin Urine SMALL (*)    Ketones, ur 20 (*)    Protein, ur 30 (*)    Squamous Epithelial / LPF 0-5 (*)    All other components within normal limits  LIPASE, BLOOD   EKG  EKG Interpretation None      Radiology No results found.  Procedures Procedures   Medications Ordered in ED Medications  ondansetron (ZOFRAN-ODT) disintegrating tablet 4 mg (4 mg Oral Given 10/21/16 1600)  sodium chloride 0.9 % bolus 1,000 mL (0 mLs Intravenous Stopped 10/21/16 1923)  ondansetron (ZOFRAN) injection 4 mg (4 mg Intravenous Given 10/21/16 1823)   Initial Impression /  Assessment and Plan / ED Course  I have reviewed the triage vital signs and the nursing notes.  Pertinent labs & imaging results that were available during my care of the patient were reviewed by me and considered in my medical decision making (see chart for details).     31 year old male with nausea, vomiting or diarrhea. Suspect viral GI illness. His abdominal exam is benign. He is currently feeling much better after some fluids and some Zofran. Some blood noted on his urinalysis. He has no specific urinary complaints. He denies myalgias. Electrolytes are okay renal function is normal. Advised to rest for the rest of the day. He needs to push fluids. He needs to make an effort to stay very well-hydrated with work and other strenuous activities.  Final Clinical Impressions(s) / ED Diagnoses   Final diagnoses:  Nausea vomiting and diarrhea  New Prescriptions New Prescriptions   No medications on file   I personally preformed the services scribed in my presence. The recorded information has been reviewed is accurate. Raeford Razor, MD.     Raeford Razor, MD 10/30/16 [phone]

## 2016-10-21 NOTE — ED Triage Notes (Signed)
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 Has not eaten today and only had pepsi and a monster energy drink. Alert and oriented.

## 2017-05-31 ENCOUNTER — Encounter (HOSPITAL_COMMUNITY): Payer: Self-pay | Admitting: Emergency Medicine

## 2017-05-31 ENCOUNTER — Other Ambulatory Visit: Payer: Self-pay

## 2017-05-31 ENCOUNTER — Emergency Department (HOSPITAL_COMMUNITY)
Admission: EM | Admit: 2017-05-31 | Discharge: 2017-05-31 | Disposition: A | Discharge status: Home / Self Care | Payer: Medicaid Other | Attending: Emergency Medicine | Admitting: Emergency Medicine

## 2017-05-31 DIAGNOSIS — K0889 Other specified disorders of teeth and supporting structures: Secondary | ICD-10-CM | POA: Diagnosis present

## 2017-05-31 DIAGNOSIS — Z79899 Other long term (current) drug therapy: Secondary | ICD-10-CM | POA: Insufficient documentation

## 2017-05-31 DIAGNOSIS — F17210 Nicotine dependence, cigarettes, uncomplicated: Secondary | ICD-10-CM | POA: Diagnosis not present

## 2017-05-31 MED ORDER — LIDOCAINE VISCOUS 2 % MT SOLN: 15.0000 mL | OROMUCOSAL | 0 refills | Status: AC | PRN

## 2017-05-31 MED ORDER — PENICILLIN V POTASSIUM 500 MG PO TABS: 500.0000 mg | ORAL_TABLET | Freq: Four times a day (QID) | ORAL | 0 refills | Status: AC

## 2017-05-31 MED ORDER — IBUPROFEN 800 MG PO TABS: 800.0000 mg | ORAL_TABLET | Freq: Three times a day (TID) | ORAL | 0 refills | Status: AC

## 2017-05-31 NOTE — ED Provider Notes (Signed)
[facility]   CSN: [id number] Arrival date & time: 05/31/17  1212     History   Chief Complaint Chief Complaint  Patient presents with  . Dental Problem    HPI Timothy Hess is a 32 y.o. male with a history of left upper dental pain that began 2 weeks ago.  He reports that it seemed as if his symptoms were improving about 5 days ago until yesterday when the area became severely painful.  He denies fever or chills.  Pain is worse with chewing on the left side.  The pain radiates from the left upper teeth up to the ear and down to the left lower teeth. He denies purulent drainage, headache, fever, chills, trismus, difficulty swallowing, feeling as if his throat is closing, or drooling.  No treatment prior to arrival.  He does not have a dentist.  He is a current everyday smoker.  No allergies to medication.  The history is provided by the patient. No language interpreter was used.  Dental Pain   This is a new problem. The current episode started more than 1 week ago. The problem occurs constantly. The problem has been gradually worsening. The pain is severe. He has tried nothing for the symptoms.    Past Medical History:  Diagnosis Date  . Abscess     Patient Active Problem List   Diagnosis Date Noted  . Epidermal cyst of neck 07/29/2014  . TOBACCO USER 01/10/2009  . Anxiety and depression 09/20/2006  . ATTENTION DEFICIT, W/HYPERACTIVITY 06/01/2006  . ECZEMA, ATOPIC DERMATITIS 06/01/2006    Past Surgical History:  Procedure Laterality Date  . APPENDECTOMY         Home Medications    Prior to Admission medications   Medication Sig Start Date End Date Taking? Authorizing Provider  Aspirin-Acetaminophen-Caffeine (GOODY HEADACHE PO) Take 1 packet by mouth 2 (two) times daily as needed (for pain).    [provider]  citalopram (CELEXA) 10 MG tablet Take 1 tablet (10 mg total) by mouth daily. Patient not  taking: Reported on 10/21/2016 09/23/14   Uvaldo RisingFletke, Kyle J, MD  ibuprofen (ADVIL,MOTRIN) 800 MG tablet Take 1 tablet (800 mg total) by mouth 3 (three) times daily. 05/31/17   Amaryah Mallen A, PA-C  lidocaine (XYLOCAINE) 2 % solution Use as directed 15 mLs in the mouth or throat as needed for mouth pain. 05/31/17   Babbie Dondlinger A, PA-C  ondansetron (ZOFRAN) 4 MG tablet Take 1 tablet (4 mg total) by mouth every 6 (six) hours. 10/21/16   Raeford RazorKohut, Stephen, MD  penicillin v potassium (VEETID) 500 MG tablet Take 1 tablet (500 mg total) by mouth 4 (four) times daily for 7 days. 05/31/17 06/07/17  Sameena Artus, Coral ElseMia A, PA-C    Family History Family History  Problem Relation Age of Onset  . Hyperlipidemia Mother   . Hypertension Mother   . Depression Mother   . Cancer Maternal Grandmother   . Cancer Maternal Grandfather     Social History Social History   Tobacco Use  . Smoking status: Current Every Day Smoker    Packs/day: 1.50    Years: 10.00    Pack years: 15.00    Types: Cigarettes  . Smokeless tobacco: Never Used  Substance Use Topics  . Alcohol use: No  . Drug use: No     Allergies   Patient has no known allergies.   Review of Systems Review of Systems  Constitutional: Negative for  appetite change, chills and fever.  HENT: Positive for dental problem, ear pain and facial swelling. Negative for drooling, nosebleeds, postnasal drip, rhinorrhea and trouble swallowing.   Eyes: Negative for pain and redness.  Respiratory: Negative for cough and wheezing.   Cardiovascular: Negative for chest pain.  Gastrointestinal: Negative for abdominal pain, nausea and vomiting.  Musculoskeletal: Negative for neck pain and neck stiffness.  Skin: Negative for color change and rash.  Allergic/Immunologic: Negative for immunocompromised state.  Neurological: Negative for weakness, light-headedness and headaches.  All other systems reviewed and are negative.    Physical Exam Updated Vital Signs BP 128/80  (BP Location: Right Arm)   Pulse 84   Temp 98.9 F (37.2 C) (Oral)   Resp 18   Ht 5\' 11"  (1.803 m)   Wt 79.4 kg (175 lb)   SpO2 100%   BMI 24.41 kg/m   Physical Exam  Constitutional: He appears well-developed.  HENT:  Head: Normocephalic.  Mouth/Throat: Uvula is midline and oropharynx is clear and moist. No trismus in the jaw. Abnormal dentition. No dental abscesses, uvula swelling or lacerations.    Tooth 27 is fractured and tender to palpation.  No surrounding purulent drainage, warmth, or erythema.  Minimal edema noted to the surrounding gingiva.  No gross abscess.  Eyes: Conjunctivae are normal.  Neck: Neck supple.  Cardiovascular: Normal rate, regular rhythm and normal heart sounds. Exam reveals no gallop and no friction rub.  No murmur heard. Pulmonary/Chest: Effort normal and breath sounds normal. No stridor. No respiratory distress. He has no wheezes. He has no rales. He exhibits no tenderness.  Abdominal: Soft. He exhibits no distension.  Neurological: He is alert.  Speaking in complete, fluent sentences without difficulty.  Skin: Skin is warm and dry.  Psychiatric: His behavior is normal.  Nursing note and vitals reviewed.    ED Treatments / Results  Labs (all labs ordered are listed, but only abnormal results are displayed) Labs Reviewed - No data to display  EKG  EKG Interpretation None       Radiology No results found.  Procedures Procedures (including critical care time)  Medications Ordered in ED Medications - No data to display   Initial Impression / Assessment and Plan / ED Course  I have reviewed the triage vital signs and the nursing notes.  Pertinent labs & imaging results that were available during my care of the patient were reviewed by me and considered in my medical decision making (see chart for details).     Patient with toothache.  No gross abscess.  Exam unconcerning for Ludwig's angina or spread of infection.  Will treat with  penicillin, lidocaine solution, and NSAIDs.  Urged patient to follow-up with dentist.  He is hemodynamically stable and in no acute distress.  The patient is safe for discharge home at this time.   Final Clinical Impressions(s) / ED Diagnoses   Final diagnoses:  Pain, dental    ED Discharge Orders        Ordered    penicillin v potassium (VEETID) 500 MG tablet  4 times daily     05/31/17 1450    ibuprofen (ADVIL,MOTRIN) 800 MG tablet  3 times daily     05/31/17 1450    lidocaine (XYLOCAINE) 2 % solution  As needed     05/31/17 1450       Elizandro Laura A, PA-C 05/31/17 1608    Gerhard Munch, MD 06/03/17 [phone]

## 2017-05-31 NOTE — Discharge Instructions (Signed)
You can call Dr. Norris Crossurner's office to schedule a follow-up appointment or use the attached dental resource guide.  Please note, most dental offices are not open on Friday, Saturday, or Sunday.  Take 1 tablet of penicillin every 6 hours for the next 7 days.  You can swish and spit 15 mL of viscous lidocaine every 3 hours to help with dental pain in the mouth.  Take 800 mg of ibuprofen with food every 8 hours or thousand milligrams of Tylenol every 8 hours or you can alternate between both medications and take 1 dose of 1 every 4 hours and then alternate after that time.  Apply a cool compress or ice to the left cheek to help with pain and swelling.  Avoid using a warm compress on the area.  You can swish and spit warm salt water, but avoid swallowing the salt water.  If you develop new or worsening symptoms, including if you become unable to open your mouth, develop significant swelling to one side of the face or neck, muffled voice, or if you begin to have drainage from the area near the tooth in your mouth, fever, or chills despite taking antibiotics, please return to the emergency department for re-evaluation.

## 2017-05-31 NOTE — ED Triage Notes (Signed)
Onset 2 weeks ago developed left upper dental pain and worsening overtime.

## 2017-10-06 ENCOUNTER — Encounter (HOSPITAL_COMMUNITY): Payer: Self-pay | Admitting: Emergency Medicine

## 2017-10-06 ENCOUNTER — Emergency Department (HOSPITAL_COMMUNITY)
Admission: EM | Admit: 2017-10-06 | Discharge: 2017-10-06 | Disposition: A | Discharge status: Home / Self Care | Payer: Medicaid Other | Attending: Emergency Medicine | Admitting: Emergency Medicine

## 2017-10-06 DIAGNOSIS — Z79899 Other long term (current) drug therapy: Secondary | ICD-10-CM | POA: Insufficient documentation

## 2017-10-06 DIAGNOSIS — F17210 Nicotine dependence, cigarettes, uncomplicated: Secondary | ICD-10-CM | POA: Insufficient documentation

## 2017-10-06 DIAGNOSIS — L0231 Cutaneous abscess of buttock: Secondary | ICD-10-CM | POA: Diagnosis not present

## 2017-10-06 MED ORDER — CEPHALEXIN 500 MG PO CAPS
500.0000 mg | ORAL_CAPSULE | Freq: Once | ORAL | Status: AC
  Administered 2017-10-06: 500 mg via ORAL
  Filled 2017-10-06: qty 1

## 2017-10-06 MED ORDER — LIDOCAINE HCL URETHRAL/MUCOSAL 2 % EX GEL
1.0000 "application " | Freq: Once | CUTANEOUS | Status: AC
  Administered 2017-10-06: 1 via TOPICAL
  Filled 2017-10-06: qty 5

## 2017-10-06 MED ORDER — SULFAMETHOXAZOLE-TRIMETHOPRIM 800-160 MG PO TABS
1.0000 | ORAL_TABLET | Freq: Once | ORAL | Status: AC
  Administered 2017-10-06: 1 via ORAL
  Filled 2017-10-06: qty 1

## 2017-10-06 MED ORDER — CEPHALEXIN 500 MG PO CAPS: 500.0000 mg | ORAL_CAPSULE | Freq: Four times a day (QID) | ORAL | 0 refills | Status: AC

## 2017-10-06 MED ORDER — SULFAMETHOXAZOLE-TRIMETHOPRIM 800-160 MG PO TABS: 1.0000 | ORAL_TABLET | Freq: Two times a day (BID) | ORAL | 0 refills | Status: AC

## 2017-10-06 MED ORDER — HYDROCODONE-ACETAMINOPHEN 5-325 MG PO TABS: 1.0000 | ORAL_TABLET | Freq: Four times a day (QID) | ORAL | 0 refills | Status: AC | PRN

## 2017-10-06 NOTE — Discharge Instructions (Addendum)
Wound Care - After I&D of Abscess  You may remove the bandage after 24 hours.  The only reason to replace the bandage is to protect clothing from drainage. Bandages, if used, should be replaced daily or whenever soiled. The wound may continue to drain for the next 2-3 days.   Clean the wound and surrounding area gently with tap water and mild soap. Rinse well and blot dry. You may shower normally. Soaking the wound in Epsom salt baths once or twice a day may help rinse out any remaining pus and help with wound healing.  Clean the wound daily to prevent further infection. Do not use cleaners such as hydrogen peroxide or alcohol.   Please take all of your antibiotics until finished!   You may develop abdominal discomfort or diarrhea from the antibiotic.  You may help offset this with probiotics which you can buy or get in yogurt. Do not eat or take the probiotics until 2 hours after your antibiotic.   Scar reduction: Application of a topical antibiotic ointment, such as Neosporin, after the wound has begun to close and heal well can decrease scab formation and reduce scarring. After the wound has healed, application of ointments such as Aquaphor can also reduce scar formation.  The key to scar reduction is keeping the skin well hydrated and supple. Drinking plenty of water throughout the day (At least eight 8oz glasses of water a day) is essential to staying well hydrated.  Sun exposure: Keep the wound out of the sun. After the wound has healed, continue to protect it from the sun by wearing protective clothing or applying sunscreen.  Pain: You may use Tylenol, naproxen, or ibuprofen for pain. Antiinflammatory medications: Take 600 mg of ibuprofen every 6 hours or 440 mg (over the counter dose) to 500 mg (prescription dose) of naproxen every 12 hours for the next 3 days. After this time, these medications may be used as needed for pain. Take these medications with food to avoid upset stomach. Choose  only one of these medications, do not take them together. Tylenol: Should you continue to have additional pain while taking the ibuprofen or naproxen, you may add in tylenol as needed. Your daily total maximum amount of tylenol from all sources should be limited to 4000mg /day for persons without liver problems, or 2000mg /day for those with liver problems. Vicodin: May take Vicodin as needed for severe pain.  Do not drive or perform other dangerous activities while taking the Vicodin.  Please note that each pill of Vicodin contains 325 mg of Tylenol and the above dosage limits apply.  Follow up: Please return to the ED or go to your primary care provider in 2-3 days for a wound check to assure proper healing.  Return to the ED sooner should signs of worsening infection arise, such as spreading redness, worsening puffiness/swelling, severe increase in pain, fever over 100.63F, or any other major issues.

## 2017-10-06 NOTE — ED Triage Notes (Signed)
Patient here from home with complaints of abscess on left buttocks x1 week. Not draining. Pain 10/10.

## 2017-10-06 NOTE — ED Provider Notes (Signed)
[facility]-EMERGENCY DEPT Provider Note   CSN: [id number] Arrival date & time: 10/06/17  1414     History   Chief Complaint Chief Complaint  Patient presents with  . Abscess    HPI Timothy LittlesJoshua S Hess is a 32 y.o. male.  HPI   Timothy Hess is a 32 y.o. male, with a history of skin abscesses, presenting to the ED with abscess to the left buttock for the last week.  Accompanied by subjective fevers.  Has tried warm compresses without success.  Has had abscesses in the past.  Denies IV drug use or HIV.  Denies drainage, numbness/tingling, weakness in the extremity, pain with bowel movements, or any other complaints.      Past Medical History:  Diagnosis Date  . Abscess     Patient Active Problem List   Diagnosis Date Noted  . Epidermal cyst of neck 07/29/2014  . TOBACCO USER 01/10/2009  . Anxiety and depression 09/20/2006  . ATTENTION DEFICIT, W/HYPERACTIVITY 06/01/2006  . ECZEMA, ATOPIC DERMATITIS 06/01/2006    Past Surgical History:  Procedure Laterality Date  . APPENDECTOMY          Home Medications    Prior to Admission medications   Medication Sig Start Date End Date Taking? Authorizing Provider  Aspirin-Acetaminophen-Caffeine (GOODY HEADACHE PO) Take 1 packet by mouth 2 (two) times daily as needed (for pain).    [provider]  cephALEXin (KEFLEX) 500 MG capsule Take 1 capsule (500 mg total) by mouth 4 (four) times daily. 10/06/17   Lee Kuang C, PA-C  citalopram (CELEXA) 10 MG tablet Take 1 tablet (10 mg total) by mouth daily. Patient not taking: Reported on 10/21/2016 09/23/14   Uvaldo RisingFletke, Kyle J, MD  HYDROcodone-acetaminophen (NORCO/VICODIN) 5-325 MG tablet Take 1-2 tablets by mouth every 6 (six) hours as needed for severe pain. 10/06/17   Mizani Dilday C, PA-C  ibuprofen (ADVIL,MOTRIN) 800 MG tablet Take 1 tablet (800 mg total) by mouth 3 (three) times daily. 05/31/17   McDonald, Mia A, PA-C  lidocaine (XYLOCAINE) 2 % solution Use as  directed 15 mLs in the mouth or throat as needed for mouth pain. 05/31/17   McDonald, Mia A, PA-C  ondansetron (ZOFRAN) 4 MG tablet Take 1 tablet (4 mg total) by mouth every 6 (six) hours. 10/21/16   Raeford RazorKohut, Stephen, MD  sulfamethoxazole-trimethoprim (BACTRIM DS,SEPTRA DS) 800-160 MG tablet Take 1 tablet by mouth 2 (two) times daily for 7 days. 10/06/17 10/13/17  Anselm PancoastJoy, Zaylin Runco C, PA-C    Family History Family History  Problem Relation Age of Onset  . Hyperlipidemia Mother   . Hypertension Mother   . Depression Mother   . Cancer Maternal Grandmother   . Cancer Maternal Grandfather     Social History Social History   Tobacco Use  . Smoking status: Current Every Day Smoker    Packs/day: 1.50    Years: 10.00    Pack years: 15.00    Types: Cigarettes  . Smokeless tobacco: Never Used  Substance Use Topics  . Alcohol use: No  . Drug use: No     Allergies   Patient has no known allergies.   Review of Systems Review of Systems  Constitutional: Positive for fever (subjective).  Skin: Positive for color change.       Abscess left buttock  Neurological: Negative for weakness and numbness.     Physical Exam Updated Vital Signs BP (!) 123/93 (BP Location: Right Arm)   Pulse 66  Temp 98.5 F (36.9 C) (Oral)   Resp 20   SpO2 100%   Physical Exam  Constitutional: He appears well-developed and well-nourished. No distress.  HENT:  Head: Normocephalic and atraumatic.  Eyes: Conjunctivae are normal.  Neck: Neck supple.  Cardiovascular: Normal rate and regular rhythm.  Pulmonary/Chest: Effort normal.  Neurological: He is alert.  Skin: Skin is warm and dry. He is not diaphoretic. No pallor.     6.5 x 5 cm fluctuant, erythematous, tender mass to the left buttock.  Does not appear to have communication with the anus or rectum.  Psychiatric: He has a normal mood and affect. His behavior is normal.  Nursing note and vitals reviewed.    ED Treatments / Results  Labs (all labs  ordered are listed, but only abnormal results are displayed) Labs Reviewed - No data to display  EKG None  Radiology No results found.  Procedures .Marland KitchenIncision and Drainage Date/Time: 10/06/2017 4:59 PM Performed by: Anselm Pancoast, PA-C Authorized by: Anselm Pancoast, PA-C   Consent:    Consent obtained:  Verbal   Consent given by:  Patient   Risks discussed:  Incomplete drainage and bleeding Location:    Type:  Abscess   Size:  6.5x5cm   Location:  Lower extremity   Lower extremity location:  Buttock   Buttock location:  L buttock Pre-procedure details:    Skin preparation:  Betadine Anesthesia (see MAR for exact dosages):    Anesthesia method:  Topical application and local infiltration   Topical anesthetic:  Lidocaine gel   Local anesthetic:  Bupivacaine 0.5% w/o epi Procedure type:    Complexity:  Complex Procedure details:    Incision types:  Cruciate   Incision depth:  Subcutaneous   Scalpel blade:  11   Wound management:  Probed and deloculated, irrigated with saline and extensive cleaning   Drainage:  Purulent   Drainage amount:  Copious   Packing materials:  1/2 in iodoform gauze Post-procedure details:    Patient tolerance of procedure:  Tolerated well, no immediate complications [place] bedside Date/Time: 10/06/2017 4:52 PM Performed by: Anselm Pancoast, PA-C Authorized by: Anselm Pancoast, PA-C  Consent: Verbal consent obtained. Risks and benefits: risks, benefits and alternatives were discussed Consent given by: patient Patient understanding: patient states understanding of the procedure being performed Patient consent: the patient's understanding of the procedure matches consent given Procedure consent: procedure consent matches procedure scheduled Patient identity confirmed: verbally with patient and provided demographic data Local anesthesia used: no  Anesthesia: Local anesthesia used: no  Sedation: Patient sedated: no  Patient tolerance: Patient tolerated the  procedure well with no immediate complications    (including critical care time)  EMERGENCY DEPARTMENT US SOFT TISSUE INTERPRETATION "Study: Limited Soft Tissue Ultrasound"  INDICATIONS: Pain and Soft tissue infection Multiple views of the body part were obtained in real-time with a multi-frequency linear probe  PERFORMED BY: Myself IMAGES ARCHIVED?: Yes SIDE:Left BODY PART:Buttock INTERPRETATION:  Abcess present and Cellulitis present    Medications Ordered in ED Medications  lidocaine (XYLOCAINE) 2 % jelly 1 application (has no administration in time range)  cephALEXin (KEFLEX) capsule 500 mg (has no administration in time range)  sulfamethoxazole-trimethoprim (BACTRIM DS,SEPTRA DS) 800-160 MG per tablet 1 tablet (has no administration in time range)     Initial Impression / Assessment and Plan / ED Course  I have reviewed the triage vital signs and the nursing notes.  Pertinent labs & imaging results that were available during  my care of the patient were reviewed by me and considered in my medical decision making (see chart for details).     Patient presents with a large abscess to the left buttock.  Doubt sepsis.  Ultrasound used to assure no tracking into the rectum.  I&D performed without immediate complication.  Patient to return for wound check in 2 to 3 days.  Patient is at risk for recurrence.  Should symptoms recur, patient has been instructed to follow-up with general surgery. The patient was given instructions for home care as well as return precautions. Patient voices understanding of these instructions, accepts the plan, and is comfortable with discharge.  Final Clinical Impressions(s) / ED Diagnoses   Final diagnoses:  Abscess of buttock, left    ED Discharge Orders        Ordered    cephALEXin (KEFLEX) 500 MG capsule  4 times daily     10/06/17 1827    sulfamethoxazole-trimethoprim (BACTRIM DS,SEPTRA DS) 800-160 MG tablet  2 times daily     10/06/17  1827    HYDROcodone-acetaminophen (NORCO/VICODIN) 5-325 MG tablet  Every 6 hours PRN     10/06/17 1827       Anselm Pancoast, PA-C 10/06/17 1831    Little, Ambrose [place], MD 10/08/17 1705
